# Patient Record
Sex: Female | Born: 2000 | Race: Black or African American | Hispanic: No | Marital: Single | Smoking: Never smoker
Health system: Southern US, Community
[De-identification: ages and names within clinical notes are randomized; demographics above are authoritative.]

## PROBLEM LIST (undated history)

## (undated) DIAGNOSIS — F909 Attention-deficit hyperactivity disorder, unspecified type: Secondary | ICD-10-CM

## (undated) HISTORY — DX: Attention-deficit hyperactivity disorder, unspecified type: F90.9

## (undated) HISTORY — PX: NO PAST SURGERIES: SHX2092

---
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
## 2020-07-31 HISTORY — PX: ROOT CANAL: SHX2363

## 2020-11-04 ENCOUNTER — Ambulatory Visit
Admission: EM | Admit: 2020-11-04 | Discharge: 2020-11-04 | Disposition: A | Discharge status: Home / Self Care | Payer: BC Managed Care – PPO

## 2020-11-04 ENCOUNTER — Other Ambulatory Visit: Payer: Self-pay

## 2020-11-04 ENCOUNTER — Encounter: Payer: Self-pay | Admitting: *Deleted

## 2020-11-04 DIAGNOSIS — T50901A Poisoning by unspecified drugs, medicaments and biological substances, accidental (unintentional), initial encounter: Secondary | ICD-10-CM

## 2020-11-04 DIAGNOSIS — F909 Attention-deficit hyperactivity disorder, unspecified type: Secondary | ICD-10-CM

## 2020-11-04 NOTE — ED Provider Notes (Signed)
[facility]    CSN: [id number] Arrival date & time: 11/04/20  1117      History   Chief Complaint Chief Complaint  Patient presents with  . Drug Overdose    HPI Sylvia Walter is a 20 y.o. female presenting today for evaluation of extra dose of Adderall.  Reports that this morning she took her normal dose of Adderall at around 9 AM with her first alarm that woke her up, went back to sleep, woke up again and accidentally took another dose as she forgot that she had previously taking it.  Shortly after she began to feel palpitations, nausea as well as jittery.  The symptoms have improved.  Does continue to feel a slight dull sensation in her chest.  Total dose was 30 mg, takes 15 mg extended release daily in the morning for ADHD.  HPI  History reviewed. No pertinent past medical history.  There are no problems to display for this patient.   History reviewed. No pertinent surgical history.  OB History   No obstetric history on file.      Home Medications    Prior to Admission medications   Medication Sig Start Date End Date Taking? Authorizing Provider  amphetamine-dextroamphetamine (ADDERALL) 15 MG tablet Take 15 mg by mouth daily.   Yes [provider]    Family History History reviewed. No pertinent family history.  Social History Social History   Tobacco Use  . Smoking status: Never Smoker  . Smokeless tobacco: Never Used     Allergies   Other   Review of Systems Review of Systems  Constitutional: Negative for fatigue and fever.  HENT: Negative for congestion, sinus pressure and sore throat.   Eyes: Negative for photophobia, pain and visual disturbance.  Respiratory: Positive for chest tightness. Negative for cough and shortness of breath.   Cardiovascular: Positive for palpitations. Negative for chest pain.  Gastrointestinal: Negative for abdominal pain, nausea and vomiting.  Genitourinary: Negative for decreased urine volume  and hematuria.  Musculoskeletal: Negative for myalgias, neck pain and neck stiffness.  Neurological: Negative for dizziness, syncope, facial asymmetry, speech difficulty, weakness, light-headedness, numbness and headaches.     Physical Exam Triage Vital Signs ED Triage Vitals  Enc Vitals Group     BP 11/04/20 1130 125/85     Pulse Rate 11/04/20 1130 93     Resp 11/04/20 1130 18     Temp 11/04/20 1130 97.6 F (36.4 C)     Temp Source 11/04/20 1130 Oral     SpO2 11/04/20 1130 99 %     Weight --      Height --      Head Circumference --      Peak Flow --      Pain Score 11/04/20 1135 0     Pain Loc --      Pain Edu? --      Excl. in GC? --    No data found.  Updated Vital Signs BP 125/85 (BP Location: Right Arm)   Pulse (!) 109   Temp 97.6 F (36.4 C) (Oral)   Resp 18   LMP 10/18/2020   SpO2 99%   Visual Acuity Right Eye Distance:   Left Eye Distance:   Bilateral Distance:    Right Eye Near:   Left Eye Near:    Bilateral Near:     Physical Exam Vitals and nursing note reviewed.  Constitutional:      Appearance: She is well-developed and  well-nourished.     Comments: No acute distress  HENT:     Head: Normocephalic and atraumatic.     Nose: Nose normal.     Mouth/Throat:     Comments: Oral mucosa pink and moist, no tonsillar enlargement or exudate. Posterior pharynx patent and nonerythematous, no uvula deviation or swelling. Normal phonation. Eyes:     Conjunctiva/sclera: Conjunctivae normal.  Cardiovascular:     Rate and Rhythm: Regular rhythm. Tachycardia present.  Pulmonary:     Effort: Pulmonary effort is normal. No respiratory distress.     Comments: Breathing comfortably at rest, CTABL, no wheezing, rales or other adventitious sounds auscultated Abdominal:     General: There is no distension.  Musculoskeletal:        General: Normal range of motion.     Cervical back: Neck supple.  Skin:    General: Skin is warm and dry.  Neurological:      Mental Status: She is alert and oriented to person, place, and time.  Psychiatric:        Mood and Affect: Mood and affect normal.      UC Treatments / Results  Labs (all labs ordered are listed, but only abnormal results are displayed) Labs Reviewed - No data to display  EKG   Radiology No results found.  Procedures Procedures (including critical care time)  Medications Ordered in UC Medications - No data to display  Initial Impression / Assessment and Plan / UC Course  I have reviewed the triage vital signs and the nursing notes.  Pertinent labs & imaging results that were available during my care of the patient were reviewed by me and considered in my medical decision making (see chart for details).     Accidental ingestion of extra dose of Adderall, total dose was 30 mg which is less than total max dose.  EKG normal sinus rhythm with sinus arrhythmia.  No acute signs of ischemia or infarction.  Advised patient to continue to monitor symptoms, drink plenty of fluids over the next 24 hours.  If symptoms progressing or worsening to follow-up in emergency room. Final Clinical Impressions(s) / UC Diagnoses   Final diagnoses:  Accidental drug ingestion, initial encounter  Attention deficit hyperactivity disorder (ADHD), unspecified ADHD type     Discharge Instructions     Please drink plenty of fluids Please monitor symptoms over the next 24 to 48 hours If symptoms progressing worsening, developing increased chest pain, heart racing greater than 120, dizziness, lightheadedness please go to emergency room    ED Prescriptions    None     PDMP not reviewed this encounter.   Sharyon Cable [place] C, PA-C 11/04/20 1230

## 2020-11-04 NOTE — ED Triage Notes (Signed)
Pt took an extra dose of adderal this morning. Pt reported her heart was pounding. Pt also reported shaking . Pt also was nauseated.  Pt made a phone call and could not remember who she called.

## 2020-11-04 NOTE — Discharge Instructions (Signed)
Please drink plenty of fluids Please monitor symptoms over the next 24 to 48 hours If symptoms progressing worsening, developing increased chest pain, heart racing greater than 120, dizziness, lightheadedness please go to emergency room

## 2020-12-22 ENCOUNTER — Ambulatory Visit
Admission: EM | Admit: 2020-12-22 | Discharge: 2020-12-22 | Disposition: A | Discharge status: Home / Self Care | Payer: BC Managed Care – PPO

## 2020-12-22 ENCOUNTER — Other Ambulatory Visit: Payer: Self-pay

## 2020-12-23 ENCOUNTER — Other Ambulatory Visit (HOSPITAL_COMMUNITY)
Admission: RE | Admit: 2020-12-23 | Discharge: 2020-12-23 | Disposition: A | Discharge status: Home / Self Care | Payer: BC Managed Care – PPO | Source: Ambulatory Visit | Attending: Nurse Practitioner | Admitting: Nurse Practitioner

## 2020-12-23 ENCOUNTER — Other Ambulatory Visit: Payer: Self-pay | Admitting: Nurse Practitioner

## 2020-12-23 DIAGNOSIS — Z113 Encounter for screening for infections with a predominantly sexual mode of transmission: Secondary | ICD-10-CM | POA: Insufficient documentation

## 2020-12-24 ENCOUNTER — Emergency Department (HOSPITAL_COMMUNITY)
Admission: EM | Admit: 2020-12-24 | Discharge: 2020-12-24 | Disposition: A | Discharge status: Home / Self Care | Payer: BC Managed Care – PPO | Attending: Emergency Medicine | Admitting: Emergency Medicine

## 2020-12-24 ENCOUNTER — Encounter (HOSPITAL_COMMUNITY): Payer: Self-pay

## 2020-12-24 ENCOUNTER — Other Ambulatory Visit: Payer: Self-pay

## 2020-12-24 ENCOUNTER — Other Ambulatory Visit: Payer: Self-pay | Admitting: Nurse Practitioner

## 2020-12-24 ENCOUNTER — Other Ambulatory Visit: Payer: BC Managed Care – PPO

## 2020-12-24 DIAGNOSIS — R000 Tachycardia, unspecified: Secondary | ICD-10-CM | POA: Insufficient documentation

## 2020-12-24 DIAGNOSIS — R599 Enlarged lymph nodes, unspecified: Secondary | ICD-10-CM

## 2020-12-24 DIAGNOSIS — B2790 Infectious mononucleosis, unspecified without complication: Secondary | ICD-10-CM | POA: Diagnosis not present

## 2020-12-24 DIAGNOSIS — R070 Pain in throat: Secondary | ICD-10-CM | POA: Diagnosis present

## 2020-12-24 DIAGNOSIS — J029 Acute pharyngitis, unspecified: Secondary | ICD-10-CM | POA: Diagnosis not present

## 2020-12-24 LAB — MOLECULAR ANCILLARY ONLY
Chlamydia: POSITIVE — AB
Comment: NEGATIVE
Comment: NORMAL
Neisseria Gonorrhea: NEGATIVE

## 2020-12-24 LAB — MONONUCLEOSIS SCREEN: Mono Screen: POSITIVE — AB

## 2020-12-24 MED ORDER — LIDOCAINE VISCOUS HCL 2 % MT SOLN
15.0000 mL | Freq: Once | OROMUCOSAL | Status: AC
  Administered 2020-12-24: 15 mL via OROMUCOSAL
  Filled 2020-12-24: qty 15

## 2020-12-24 MED ORDER — CEFTRIAXONE SODIUM 500 MG IJ SOLR
500.0000 mg | Freq: Once | INTRAMUSCULAR | Status: AC
  Administered 2020-12-24: 500 mg via INTRAMUSCULAR
  Filled 2020-12-24: qty 500

## 2020-12-24 MED ORDER — ACETAMINOPHEN 500 MG PO TABS
1000.0000 mg | ORAL_TABLET | Freq: Once | ORAL | Status: DC
  Filled 2020-12-24: qty 2

## 2020-12-24 MED ORDER — STERILE WATER FOR INJECTION IJ SOLN
INTRAMUSCULAR | Status: AC
  Filled 2020-12-24: qty 10

## 2020-12-24 MED ORDER — ACETAMINOPHEN 500 MG PO TABS: 1000.0000 mg | ORAL_TABLET | Freq: Four times a day (QID) | ORAL | 0 refills | Status: DC | PRN

## 2020-12-24 MED ORDER — DOXYCYCLINE HYCLATE 100 MG PO CAPS: 100.0000 mg | ORAL_CAPSULE | Freq: Two times a day (BID) | ORAL | 0 refills | Status: DC

## 2020-12-24 MED ORDER — LIDOCAINE VISCOUS HCL 2 % MT SOLN: 15.0000 mL | OROMUCOSAL | 0 refills | Status: DC | PRN

## 2020-12-24 NOTE — ED Provider Notes (Signed)
[facility] EMERGENCY DEPARTMENT Provider Note   CSN: [id number] Arrival date & time: 12/24/20  1153     History No chief complaint on file.   Sylvia Walter is a 20 y.o. female.  The history is provided by the patient and medical records. No language interpreter was used.     20 year old female presents complaint sore throat.  For the past 4 days patient has had sore throat.  She described as a very uncomfortable sensation especially with talking eating and drinking and swallowing.  Pain of moderate in severity radiates towards her ear and down her neck.  She denies any associated fever chills no nausea vomiting diarrhea no loss of taste or smell no chest pain shortness of breath abdominal pain or back pain.  She was seen at an outside hospital yesterday for complaint and had a negative strep test.  She was prescribed azithromycin, and lozenges.  She still having discomfort prompting this ER visit.  When asked, she did report having new sexual partner and recently had oral sex as well.  Symptoms started shortly after that.  She denies any abnormal rash or vaginal bleeding or vaginal discharge.  History reviewed. No pertinent past medical history.  There are no problems to display for this patient.   History reviewed. No pertinent surgical history.   OB History   No obstetric history on file.     No family history on file.  Social History   Tobacco Use  . Smoking status: Never Smoker  . Smokeless tobacco: Never Used    Home Medications Prior to Admission medications   Medication Sig Start Date End Date Taking? Authorizing Provider  amphetamine-dextroamphetamine (ADDERALL) 15 MG tablet Take 15 mg by mouth daily.    [provider]    Allergies    Other  Review of Systems   Review of Systems  All other systems reviewed and are negative.   Physical Exam Updated Vital Signs BP 122/72 (BP Location: Right Arm)   Pulse (!) 103   Temp  99.5 F (37.5 C) (Oral)   Resp 17   SpO2 100%   Physical Exam Vitals and nursing note reviewed.  Constitutional:      General: She is not in acute distress.    Appearance: She is well-developed.  HENT:     Head: Atraumatic.     Mouth/Throat:     Comments: Uvula midline bilateral tonsillar enlargement with exudates but no trismus noted.  No petechiae. Eyes:     Conjunctiva/sclera: Conjunctivae normal.  Cardiovascular:     Rate and Rhythm: Tachycardia present.     Pulses: Normal pulses.     Heart sounds: Normal heart sounds.  Pulmonary:     Effort: Pulmonary effort is normal.     Breath sounds: Normal breath sounds.  Abdominal:     Palpations: Abdomen is soft.     Comments: No splenomegaly  Musculoskeletal:     Cervical back: Neck supple.  Skin:    Findings: No rash.  Neurological:     Mental Status: She is alert and oriented to person, place, and time.  Psychiatric:        Mood and Affect: Mood normal.     ED Results / Procedures / Treatments   Labs (all labs ordered are listed, but only abnormal results are displayed) Labs Reviewed  MONONUCLEOSIS SCREEN - Abnormal; Notable for the following components:      Result Value   Mono Screen POSITIVE (*)  All other components within normal limits    EKG None  Radiology No results found.  Procedures Procedures   Medications Ordered in ED Medications  acetaminophen (TYLENOL) tablet 1,000 mg (has no administration in time range)  cefTRIAXone (ROCEPHIN) injection 500 mg (has no administration in time range)  lidocaine (XYLOCAINE) 2 % viscous mouth solution 15 mL (15 mLs Mouth/Throat Given 12/24/20 1417)    ED Course  I have reviewed the triage vital signs and the nursing notes.  Pertinent labs & imaging results that were available during my care of the patient were reviewed by me and considered in my medical decision making (see chart for details).    MDM Rules/Calculators/A&P                          BP  (!) 125/96   Pulse (!) 116   Temp 99.5 F (37.5 C) (Oral)   Resp 16   SpO2 100%   Final Clinical Impression(s) / ED Diagnoses Final diagnoses:  Acute pharyngitis due to infectious mononucleosis    Rx / DC Orders ED Discharge Orders         Ordered    lidocaine (XYLOCAINE) 2 % solution  As needed        12/24/20 1513    acetaminophen (TYLENOL) 500 MG tablet  Every 6 hours PRN        12/24/20 1513    doxycycline (VIBRAMYCIN) 100 MG capsule  2 times daily        12/24/20 1513         1:54 PM Patient here with sore throat, and had negative strep test yesterday.  No throat exam remarkable for bilateral tonsillar enlargement with exudates but no deviation of the uvula and no peritonsillar abscess.  Low suspicion for retropharyngeal abscess or any kind of airway compromise.  Other differential was mononucleosis, gonococcal pharyngitis, strep pharyngitis   Mono test positive.  Will treat symptoms.  2:49 PM Patient is notified that her throat swab from yesterday also positive for chlamydia.  She has not received any specific treatment yet.  She mention her pregnancy test from yesterday was negative.  Given this finding, will cover for STI infection with Rocephin and doxycycline.  I will also prescribe viscous lidocaine for symptom control, Tylenol as needed for fever and pain.  Return precaution given.   Fayrene Helper, PA-C 12/24/20 1519    Jacalyn Lefevre, MD 12/24/20 [phone]

## 2020-12-24 NOTE — ED Triage Notes (Signed)
Patient complains of intermittent sore throat that originally started 2 weeks ago and then returned again on Saturday. Pain with swallowing and pain to left ear. Had negative strep yesterday

## 2020-12-24 NOTE — Discharge Instructions (Addendum)
You test positive for mononucleosis.  Please drink plenty of fluid, take Tylenol as needed for pain and fever, use viscous lidocaine liquid swish and spit or swish and swallow as needed for sore throat.  Since you mentioned that your chlamydia test is positive, I have prescribed antibiotic as treatment.

## 2020-12-24 NOTE — ED Triage Notes (Signed)
Emergency Medicine Provider Triage Evaluation Note  MARY-ANNE POLIZZI , a 20 y.o. female  was evaluated in triage.  Pt complains of sore throat since Saturday, now have difficulty swallowing. Tested for strep yesterday was negative.   Review of Systems  Positive: Sore throat, difficulty swallowing, ear pain Negative: fever  Physical Exam  BP 113/78 (BP Location: Left Arm)   Pulse (!) 101   Temp 99.6 F (37.6 C)   Resp 14   SpO2 100%  Gen:   Awake, no distress   HEENT:  No trismus, drooling, handling secretions. Resp:  Normal effort  Cardiac:  Normal rate  Abd:   Nondistended, nontender  MSK:   Moves extremities without difficulty Neuro:  Speech clear   Medical Decision Making  Medically screening exam initiated at 12:22 PM.  Appropriate orders placed.  Candelaria R Colao was informed that the remainder of the evaluation will be completed by another provider, this initial triage assessment does not replace that evaluation, and the importance of remaining in the ED until their evaluation is complete.  Clinical Impression  Stable, no immediate airway compromise  MSE was initiated and I personally evaluated the patient and placed orders (if any) at  12:23 PM on December 24, 2020.  The patient appears stable so that the remainder of the MSE may be completed by another provider.    Farrel Gordon, PA-C 12/24/20 1223

## 2021-01-31 ENCOUNTER — Other Ambulatory Visit (HOSPITAL_COMMUNITY)
Admission: RE | Admit: 2021-01-31 | Discharge: 2021-01-31 | Disposition: A | Discharge status: Home / Self Care | Payer: BC Managed Care – PPO | Source: Ambulatory Visit | Attending: Family Medicine | Admitting: Family Medicine

## 2021-01-31 ENCOUNTER — Other Ambulatory Visit: Payer: Self-pay | Admitting: Family Medicine

## 2021-01-31 DIAGNOSIS — N898 Other specified noninflammatory disorders of vagina: Secondary | ICD-10-CM | POA: Diagnosis present

## 2021-02-05 LAB — MOLECULAR ANCILLARY ONLY
Bacterial Vaginitis (gardnerella): NEGATIVE
Candida Glabrata: NEGATIVE
Candida Vaginitis: NEGATIVE
Chlamydia: NEGATIVE
Comment: NEGATIVE
Comment: NEGATIVE
Comment: NEGATIVE
Comment: NEGATIVE
Comment: NEGATIVE
Comment: NORMAL
Neisseria Gonorrhea: NEGATIVE
Trichomonas: NEGATIVE

## 2021-09-07 NOTE — L&D Delivery Note (Addendum)
Delivery Note Sylvia Walter is a 21 y.o. G1P1002 at [redacted]w[redacted]d admitted for IOL d/t DCDA twins, GHTN, A1DM.   GBS Status: Negative/-- (06/28 1632) Maximum Maternal Temperature: 99.3  Labor course: Initial SVE: 1.5/50/-3. Augmentation with: AROM, Cytotec, and IP Foley. She then progressed to complete.  ROM: 4h 74m with clear fluid  Moved to OR for delivery d/t twins and Cat 2 tracing Twin B Birth: At 0042 a viable female was delivered via spontaneous vaginal delivery (Presentation: LOA w/ compound hand). Nuchal cord present: Yes, loose x 1, immediately reduced at birth.  Shoulders and body delivered in usual fashion. Infant placed directly on mom's abdomen for bonding/skin-to-skin, baby dried and stimulated. Cord clamped x 2 after 1 minute and cut by FOB. Informal bedside u/s by Dr. Macon Large, Baby B was breech, BBOW felt by Dr. Macon Large and myself, pt pushed ineffectively at first, then much better- AROM after presenting part out, which was the buttocks, breech delivery of Baby B at 0058 by Dr. Macon Large. Cord clamped x 2 and cut and infant taken immediately to warmer for resuscitation by NICU team.   The placenta separated spontaneously with significant clots and delivered simultaneously with Baby B concerning for abruption.  Pitocin infused rapidly IV per protocol.  Fundus boggy, large amt clots removed from LUS. Given methergine x 1, TXA, IV pitocin and then Cytotec pr w/ firming of fundus and slowed bleeding.  Placenta inspected and appears to be intact with a 3 VC x 2.  Placenta/Cord with the following complications: multiple calcifications on both and large clots adherent to them.  Cord pH: drawn for Baby B pH 6.9 Sponge and instrument count were correct x2.  Intrapartum complications:  Abruptio Placenta BabyB Anesthesia:  epidural & local for repair per pt request Episiotomy: none Lacerations:  1st degree Suture Repair: 3.0 vicryl EBL (mL): 400   Infant: APGARS Baby A: 8/9 Baby B:  1/8, aggressively resuscitated by Neonatology team  Infant weight: pending  Mom to postpartum.  Baby to Couplet care / Skin to Skin.  Plans to Breastfeed Contraception:  undecided Circumcision: N/A  Note sent to [facility]: [facility] for pp visit.  [name] CNM, [facility] 03/18/2022 1:31 AM    I was present for entire delivery and did the delivery of Baby B. Agree with documentation above.    [name], MD, FACOG Obstetrician & Gynecologist, [facility] for [employer], [facility]

## 2021-09-29 ENCOUNTER — Ambulatory Visit (INDEPENDENT_AMBULATORY_CARE_PROVIDER_SITE_OTHER): Payer: BC Managed Care – PPO

## 2021-09-29 DIAGNOSIS — O0990 Supervision of high risk pregnancy, unspecified, unspecified trimester: Secondary | ICD-10-CM | POA: Insufficient documentation

## 2021-09-29 DIAGNOSIS — Z3401 Encounter for supervision of normal first pregnancy, first trimester: Secondary | ICD-10-CM | POA: Insufficient documentation

## 2021-09-29 DIAGNOSIS — O30041 Twin pregnancy, dichorionic/diamniotic, first trimester: Secondary | ICD-10-CM

## 2021-09-29 MED ORDER — BLOOD PRESSURE KIT DEVI: 1.0000 | 0 refills | Status: DC

## 2021-09-29 NOTE — Progress Notes (Signed)
New OB Intake  I connected with  Sylvia Walter on 09/29/21 at  2:00 PM EST by telephone Video Visit and verified that I am speaking with the correct person using two identifiers. Nurse is located at [facility] and pt is located at Home.  I discussed the limitations, risks, security and privacy concerns of performing an evaluation and management service by telephone and the availability of in person appointments. I also discussed with the patient that there may be a patient responsible charge related to this service. The patient expressed understanding and agreed to proceed.  I explained I am completing New OB Intake today. We discussed her EDD of 04/05/22 that is based on early u/s. Pt is G1/P0. I reviewed her allergies, medications, Medical/Surgical/OB history, and appropriate screenings. I informed her of [facility] services. Based on history, this is a/an  pregnancy uncomplicated .   There are no problems to display for this patient.   Concerns addressed today  Delivery Plans:  Plans to deliver at [facility].   MyChart/Babyscripts MyChart access verified. I explained pt will have some visits in office and some virtually. Babyscripts instructions given and order placed. Patient verifies receipt of registration text/e-mail. Account successfully created and app downloaded.  Blood Pressure Cuff  Blood pressure cuff ordered for patient to pick-up from [employer]. Explained after first prenatal appt pt will check weekly and document in Babyscripts.  Weight scale: Patient does / does not  have weight scale. Weight scale ordered for patient to pick up from [employer].   Anatomy US Explained first scheduled [place] will be around 19 weeks. Anatomy US ordered.  Labs Discussed Avelina Laine genetic screening with patient. Would like both Panorama and Horizon drawn at new OB visit. Routine prenatal labs needed.  Covid Vaccine Patient has covid vaccine.    Informed patient of [facility]  website  and placed link in her AVS.   Social Determinants of Health Food Insecurity: Patient expresses food insecurity. Food Market information given to patient; explained patient may visit at the end of first OB appointment. WIC Referral: Patient is interested in referral to [facility].  Transportation: Patient denies transportation needs. Childcare: Discussed no children allowed at ultrasound appointments. Offered childcare services; patient declines childcare services at this time.  Send link to Pregnancy Navigators   Placed OB Box on problem list and updated  First visit review I reviewed new OB appt with pt. I explained she will have a pelvic exam, ob bloodwork with genetic screening, and PAP smear. Explained pt will be seen by Mariel Aloe at first visit; encounter routed to appropriate provider. Explained that patient will be seen by pregnancy navigator following visit with provider. [facility] information placed in AVS.   Hamilton Capri, RN 09/29/2021  1:49 PM

## 2021-09-30 NOTE — Progress Notes (Signed)
Patient was assessed and managed by nursing staff during this encounter. I have reviewed the chart and agree with the documentation and plan. I have also made any necessary editorial changes.  Catalina Antigua, MD 09/30/2021 5:44 PM

## 2021-10-01 ENCOUNTER — Other Ambulatory Visit: Payer: Self-pay

## 2021-10-01 ENCOUNTER — Other Ambulatory Visit (HOSPITAL_COMMUNITY)
Admission: RE | Admit: 2021-10-01 | Discharge: 2021-10-01 | Disposition: A | Discharge status: Home / Self Care | Payer: BC Managed Care – PPO | Source: Ambulatory Visit | Attending: Obstetrics and Gynecology | Admitting: Obstetrics and Gynecology

## 2021-10-01 ENCOUNTER — Ambulatory Visit (INDEPENDENT_AMBULATORY_CARE_PROVIDER_SITE_OTHER): Payer: BC Managed Care – PPO | Admitting: Licensed Clinical Social Worker

## 2021-10-01 ENCOUNTER — Ambulatory Visit (INDEPENDENT_AMBULATORY_CARE_PROVIDER_SITE_OTHER): Payer: BC Managed Care – PPO | Admitting: Obstetrics and Gynecology

## 2021-10-01 VITALS — BP 110/72 | HR 92 | Ht 67.0 in | Wt 146.7 lb

## 2021-10-01 DIAGNOSIS — O30041 Twin pregnancy, dichorionic/diamniotic, first trimester: Secondary | ICD-10-CM | POA: Insufficient documentation

## 2021-10-01 DIAGNOSIS — R87612 Low grade squamous intraepithelial lesion on cytologic smear of cervix (LGSIL): Secondary | ICD-10-CM | POA: Insufficient documentation

## 2021-10-01 DIAGNOSIS — R112 Nausea with vomiting, unspecified: Secondary | ICD-10-CM | POA: Insufficient documentation

## 2021-10-01 DIAGNOSIS — Z3A13 13 weeks gestation of pregnancy: Secondary | ICD-10-CM | POA: Diagnosis not present

## 2021-10-01 DIAGNOSIS — Z3401 Encounter for supervision of normal first pregnancy, first trimester: Secondary | ICD-10-CM

## 2021-10-01 DIAGNOSIS — O30049 Twin pregnancy, dichorionic/diamniotic, unspecified trimester: Secondary | ICD-10-CM | POA: Insufficient documentation

## 2021-10-01 DIAGNOSIS — O219 Vomiting of pregnancy, unspecified: Secondary | ICD-10-CM

## 2021-10-01 MED ORDER — DOXYLAMINE-PYRIDOXINE 10-10 MG PO TBEC: 2.0000 | DELAYED_RELEASE_TABLET | Freq: Every day | ORAL | 5 refills | Status: DC

## 2021-10-01 NOTE — Progress Notes (Signed)
INITIAL PRENATAL VISIT NOTE  Subjective:  Sylvia Walter is a 21 y.o. G1P0 at 25w3dby [facility] department ultrasound being seen today for her initial prenatal visit. She has an obstetric history significant for nulliparity. She has a medical history significant for nothing.  Patient reports nausea.  Contractions: Irritability. Vag. Bleeding: None.   . Denies leaking of fluid.    Past Medical History:  Diagnosis Date   ADHD     No past surgical history on file.  OB History  Gravida Para Term Preterm AB Living  1         0  SAB IAB Ectopic Multiple Live Births          0    # Outcome Date GA Lbr Len/2nd Weight Sex Delivery Anes PTL Lv  1 Current             Social History   Socioeconomic History   Marital status: Single    Spouse name: Not on file   Number of children: Not on file   Years of education: Not on file   Highest education level: Not on file  Occupational History   Not on file  Tobacco Use   Smoking status: Never   Smokeless tobacco: Never  Vaping Use   Vaping Use: Never used  Substance and Sexual Activity   Alcohol use: Not Currently    Comment: occ, prior to pregnancy   Drug use: Never   Sexual activity: Not on file  Other Topics Concern   Not on file  Social History Narrative   Not on file   Social Determinants of Health   Financial Resource Strain: Not on file  Food Insecurity: Not on file  Transportation Needs: Not on file  Physical Activity: Not on file  Stress: Not on file  Social Connections: Not on file    No family history on file.   Current Outpatient Medications:    Doxylamine-Pyridoxine (DICLEGIS) 10-10 MG TBEC, Take 2 tablets by mouth at bedtime. If symptoms persist, add one tablet in the morning and one in the afternoon, Disp: 100 tablet, Rfl: 5   Prenatal Vit-Fe Fumarate-FA (PRENATAL MULTIVITAMIN) TABS tablet, Take 1 tablet by mouth daily at 12 noon., Disp: , Rfl:    acetaminophen (TYLENOL) 500 MG tablet, Take 2 tablets  (1,000 mg total) by mouth every 6 (six) hours as needed for moderate pain, fever or headache. (Patient not taking: Reported on 09/29/2021), Disp: 30 tablet, Rfl: 0   amphetamine-dextroamphetamine (ADDERALL XR) 10 MG 24 hr capsule, Take 10 mg by mouth every morning. (Patient not taking: Reported on 09/29/2021), Disp: , Rfl:    Blood Pressure Monitoring (BLOOD PRESSURE KIT) DEVI, 1 kit by Does not apply route once a week., Disp: 1 each, Rfl: 0   ondansetron (ZOFRAN-ODT) 4 MG disintegrating tablet, 4 mg 2 (two) times daily. (Patient not taking: Reported on 09/29/2021), Disp: , Rfl:    promethazine (PHENERGAN) 25 MG tablet, Take 25 mg by mouth 3 (three) times daily. (Patient not taking: Reported on 09/29/2021), Disp: , Rfl:   Allergies  Allergen Reactions   Other Other (See Comments)    Grass/ pecans    Review of Systems: Negative except for what is mentioned in HPI.  Objective:   Vitals:   10/01/21 1321 10/01/21 1322  BP: 110/72   Pulse: 92   Weight: 146 lb 11.2 oz (66.5 kg)   Height:  5' 7"  (1.702 m)    Fetal Status:  Physical Exam: BP 110/72    Pulse 92    Ht 5' 7"  (1.702 m)    Wt 146 lb 11.2 oz (66.5 kg)    BMI 22.98 kg/m  CONSTITUTIONAL: Well-developed, well-nourished female in no acute distress.  NEUROLOGIC: Alert and oriented to person, place, and time. Normal reflexes, muscle tone coordination. No cranial nerve deficit noted. PSYCHIATRIC: Normal mood and affect. Normal behavior. Normal judgment and thought content. SKIN: Skin is warm and dry. No rash noted. Not diaphoretic. No erythema. No pallor. HENT:  Normocephalic, atraumatic, External right and left ear normal. Oropharynx is clear and moist EYES: Conjunctivae and EOM are normal.  NECK: Normal range of motion, supple, no masses CARDIOVASCULAR: Normal heart rate noted, regular rhythm RESPIRATORY: Effort and breath sounds normal, no problems with respiration noted BREASTS: deferred ABDOMEN: Soft, nontender,  nondistended, gravid. GU: normal appearing external female genitalia, nulliparous, normal appearing cervix, scant white discharge in vagina, no lesions noted Bimanual: 15 weeks sized uterus, no adnexal tenderness or palpable lesions noted MUSCULOSKELETAL: Normal range of motion. EXT:  No edema and no tenderness. 2+ distal pulses.  Bedside ultrasound showed viable fetus x 2 with movement, appears to be di/di  Assessment and Plan:  Pregnancy: G1P0 at 90w3dby u/s  1. Encounter for supervision of normal first pregnancy in first trimester Continue routine care, AFP at next visit.  - CBC/D/Plt+RPR+Rh+ABO+RubIgG... - Culture, OB Urine - Genetic Screening - Cytology - PAP - Cervicovaginal ancillary only  2. [redacted] weeks gestation of pregnancy   3. Twin pregnancy, dichorionic/diamniotic, first trimester Confirm at anatomy scan  4. Nausea and vomiting during pregnancy Trial of diclegis  - Doxylamine-Pyridoxine (DICLEGIS) 10-10 MG TBEC; Take 2 tablets by mouth at bedtime. If symptoms persist, add one tablet in the morning and one in the afternoon  Dispense: 100 tablet; Refill: 5   Preterm labor symptoms and general obstetric precautions including but not limited to vaginal bleeding, contractions, leaking of fluid and fetal movement were reviewed in detail with the patient.  Please refer to After Visit Summary for other counseling recommendations.   Return in about 4 weeks (around 10/29/2021[facility]  LGriffin Basil1/25/2023 1:58 PM

## 2021-10-01 NOTE — Patient Instructions (Signed)
For colds and allergies  Any anti-histamine including benadryl, allegra, claritin, etc.  Sudafed but not phenylephrine  Mucinex  Robitussin  For Reflux/heartburn  Pepcid Zantac Tums Prilosec Prevacid  For yeast infections  Monistat  For constipation  Colace  For minor aches and pains  Tylenol-do not take more than 4000mg in 24 hours. Therma-care or like heat packs  

## 2021-10-02 LAB — CBC/D/PLT+RPR+RH+ABO+RUBIGG...
Antibody Screen: NEGATIVE
Basophils Absolute: 0 10*3/uL (ref 0.0–0.2)
Basos: 0 %
EOS (ABSOLUTE): 0.2 10*3/uL (ref 0.0–0.4)
Eos: 2 %
HCV Ab: <0.1 s/co ratio (ref 0.0–0.9)
HIV Screen 4th Generation wRfx: NONREACTIVE
Hematocrit: 35.6 % (ref 34.0–46.6)
Hemoglobin: 11.1 g/dL (ref 11.1–15.9)
Hepatitis B Surface Ag: NEGATIVE
Immature Grans (Abs): 0 10*3/uL (ref 0.0–0.1)
Immature Granulocytes: 0 %
Lymphocytes Absolute: 1.7 10*3/uL (ref 0.7–3.1)
Lymphs: 18 %
MCH: 23.4 pg — ABNORMAL LOW (ref 26.6–33.0)
MCHC: 31.2 g/dL — ABNORMAL LOW (ref 31.5–35.7)
MCV: 75 fL — ABNORMAL LOW (ref 79–97)
Monocytes Absolute: 0.6 10*3/uL (ref 0.1–0.9)
Monocytes: 7 %
Neutrophils Absolute: 6.7 10*3/uL (ref 1.4–7.0)
Neutrophils: 73 %
Platelets: 255 10*3/uL (ref 150–450)
RBC: 4.74 x10E6/uL (ref 3.77–5.28)
RDW: 17.1 % — ABNORMAL HIGH (ref 11.7–15.4)
RPR Ser Ql: NONREACTIVE
Rh Factor: POSITIVE
Rubella Antibodies, IGG: 8.69 index (ref >0.99)
WBC: 9.2 10*3/uL (ref 3.4–10.8)

## 2021-10-02 LAB — HCV INTERPRETATION

## 2021-10-02 NOTE — BH Specialist Note (Signed)
[facility] Initial In-Person Visit  MRN: [id number] Name: Sylvia Walter  Number of [facility] Clinician visits:: 1 Session Start time: 1:46pm  Session End time: 2:01 Total time: 15 minutes in person at [place]. Linton Rump and M Sheehan completed new ob introduction. Ms. Arambul expressed full understanding of practice offerings and services.     Lynnea Ferrier, LCSW

## 2021-10-03 LAB — CERVICOVAGINAL ANCILLARY ONLY
Bacterial Vaginitis (gardnerella): NEGATIVE
Candida Glabrata: NEGATIVE
Candida Vaginitis: NEGATIVE
Chlamydia: NEGATIVE
Comment: NEGATIVE
Comment: NEGATIVE
Comment: NEGATIVE
Comment: NEGATIVE
Comment: NEGATIVE
Comment: NORMAL
Neisseria Gonorrhea: NEGATIVE
Trichomonas: NEGATIVE

## 2021-10-03 LAB — CULTURE, OB URINE

## 2021-10-03 LAB — URINE CULTURE, OB REFLEX

## 2021-10-07 LAB — CYTOLOGY - PAP
Adequacy: ABSENT
Chlamydia: NEGATIVE
Comment: NEGATIVE
Comment: NORMAL
Neisseria Gonorrhea: NEGATIVE

## 2021-10-13 ENCOUNTER — Encounter: Payer: Self-pay | Admitting: Obstetrics and Gynecology

## 2021-10-25 ENCOUNTER — Encounter: Payer: Self-pay | Admitting: Obstetrics & Gynecology

## 2021-10-25 DIAGNOSIS — D563 Thalassemia minor: Secondary | ICD-10-CM

## 2021-10-25 HISTORY — DX: Thalassemia minor: D56.3

## 2021-10-27 ENCOUNTER — Telehealth: Payer: Self-pay

## 2021-10-27 DIAGNOSIS — D563 Thalassemia minor: Secondary | ICD-10-CM

## 2021-10-27 NOTE — Telephone Encounter (Signed)
S/w pt and advised of  genetic results and options. Genetic referral placed

## 2021-10-27 NOTE — Telephone Encounter (Signed)
Attempted to contact about genetic results, no answer, left vm

## 2021-10-29 ENCOUNTER — Other Ambulatory Visit: Payer: Self-pay

## 2021-10-29 ENCOUNTER — Ambulatory Visit (INDEPENDENT_AMBULATORY_CARE_PROVIDER_SITE_OTHER): Payer: BC Managed Care – PPO | Admitting: Obstetrics and Gynecology

## 2021-10-29 ENCOUNTER — Encounter: Payer: Self-pay | Admitting: Obstetrics and Gynecology

## 2021-10-29 VITALS — BP 116/74 | HR 91 | Wt 153.9 lb

## 2021-10-29 DIAGNOSIS — D563 Thalassemia minor: Secondary | ICD-10-CM

## 2021-10-29 DIAGNOSIS — O0992 Supervision of high risk pregnancy, unspecified, second trimester: Secondary | ICD-10-CM

## 2021-10-29 DIAGNOSIS — O30041 Twin pregnancy, dichorionic/diamniotic, first trimester: Secondary | ICD-10-CM

## 2021-10-29 NOTE — Progress Notes (Signed)
Subjective:  Sylvia Walter is a 21 y.o. G1P0 at [redacted]w[redacted]d being seen today for ongoing prenatal care.  She is currently monitored for the following issues for this high-risk pregnancy and has Supervision of high-risk pregnancy; Twin pregnancy, dichorionic/diamniotic, first trimester; Nausea and vomiting during pregnancy; and Alpha thalassemia silent carrier on their problem list.  Patient reports no complaints.  Contractions: Not present. Vag. Bleeding: None.   . Denies leaking of fluid.   The following portions of the patient's history were reviewed and updated as appropriate: allergies, current medications, past family history, past medical history, past social history, past surgical history and problem list. Problem list updated.  Objective:   Vitals:   10/29/21 1515  BP: 116/74  Pulse: 91  Weight: 153 lb 14.4 oz (69.8 kg)    Fetal Status: Fetal Heart Rate (bpm): 138/134         General:  Alert, oriented and cooperative. Patient is in no acute distress.  Skin: Skin is warm and dry. No rash noted.   Cardiovascular: Normal heart rate noted  Respiratory: Normal respiratory effort, no problems with respiration noted  Abdomen: Soft, gravid, appropriate for gestational age. Pain/Pressure: Present     Pelvic:  Cervical exam deferred        Extremities: Normal range of motion.     Mental Status: Normal mood and affect. Normal behavior. Normal judgment and thought content.   Urinalysis:      Assessment and Plan:  Pregnancy: G1P0 at [redacted]w[redacted]d  1. Supervision of high risk pregnancy in second trimester Stable  2. Twin pregnancy, dichorionic/diamniotic, first trimester Stable Anatomy scan next month Serial growth scans as per MFM  3. Alpha thalassemia silent carrier FOB has been advised to be tested  Preterm labor symptoms and general obstetric precautions including but not limited to vaginal bleeding, contractions, leaking of fluid and fetal movement were reviewed in detail with the  patient. Please refer to After Visit Summary for other counseling recommendations.  Return in about 4 weeks (around 11/26/2021) for OB visit, face to face, MD only.   Chancy Milroy, MD

## 2021-10-29 NOTE — Patient Instructions (Signed)

## 2021-10-29 NOTE — Progress Notes (Deleted)
Pt requesting Zofran

## 2021-11-10 ENCOUNTER — Ambulatory Visit: Payer: BC Managed Care – PPO | Attending: Obstetrics and Gynecology | Admitting: Obstetrics and Gynecology

## 2021-11-10 ENCOUNTER — Ambulatory Visit: Payer: BC Managed Care – PPO | Admitting: *Deleted

## 2021-11-10 ENCOUNTER — Other Ambulatory Visit: Payer: Self-pay

## 2021-11-10 ENCOUNTER — Other Ambulatory Visit: Payer: Self-pay | Admitting: *Deleted

## 2021-11-10 ENCOUNTER — Ambulatory Visit: Payer: BC Managed Care – PPO | Attending: Obstetrics and Gynecology

## 2021-11-10 ENCOUNTER — Ambulatory Visit: Payer: BC Managed Care – PPO

## 2021-11-10 ENCOUNTER — Encounter: Payer: Self-pay | Admitting: *Deleted

## 2021-11-10 VITALS — BP 116/63 | HR 89

## 2021-11-10 DIAGNOSIS — O30042 Twin pregnancy, dichorionic/diamniotic, second trimester: Secondary | ICD-10-CM | POA: Diagnosis not present

## 2021-11-10 DIAGNOSIS — D563 Thalassemia minor: Secondary | ICD-10-CM | POA: Insufficient documentation

## 2021-11-10 DIAGNOSIS — Z3A19 19 weeks gestation of pregnancy: Secondary | ICD-10-CM | POA: Diagnosis not present

## 2021-11-10 DIAGNOSIS — Z148 Genetic carrier of other disease: Secondary | ICD-10-CM

## 2021-11-10 DIAGNOSIS — O30032 Twin pregnancy, monochorionic/diamniotic, second trimester: Secondary | ICD-10-CM | POA: Insufficient documentation

## 2021-11-10 DIAGNOSIS — Z3401 Encounter for supervision of normal first pregnancy, first trimester: Secondary | ICD-10-CM

## 2021-11-10 DIAGNOSIS — Z363 Encounter for antenatal screening for malformations: Secondary | ICD-10-CM | POA: Insufficient documentation

## 2021-11-10 DIAGNOSIS — O30041 Twin pregnancy, dichorionic/diamniotic, first trimester: Secondary | ICD-10-CM | POA: Diagnosis not present

## 2021-11-10 DIAGNOSIS — Z362 Encounter for other antenatal screening follow-up: Secondary | ICD-10-CM

## 2021-11-10 NOTE — Progress Notes (Signed)
Maternal-Fetal Medicine  ? ?Name: Sylvia Walter ?DOB: 04/25/2001 ?MRN: [id number] ?Referring Provider: Arlina Robes, MD ? ?I had the pleasure of seeing Sylvia Walter today at the [place] [facility]. She is G1 P0 at 19w 1d gestation with twin pregnancy. This is a natural conception. ? ?Patient had first trimester ultrasound at [redacted] weeks gestation at [place].  I reviewed the images, and they are consistent with dichorionic-diamniotic twin gestation. ? ?On cell-free fetal DNA screening, monozygotic twins with female sex twins were seen. ? ?Past medical history no history of hypertension or diabetes or any chronic medical conditions.  She does not have sickle cell trait and she is a silent carrier for alpha thalassemia. ?Past surgical history: Nil of note. ?Medications: Prenatal vitamins. ?Allergies: No known drug allergies. ?Social history: Denies tobacco or drug or alcohol use.  She lives with a partner who is African-American and he is in good health. ?Family history: No history of venous thromboembolism in the family. ? ?Ultrasound ?Dichorionic-diamniotic twin pregnancy. ? ?Twin A: Lower fetus, variable presentation, anterior placenta, female fetus.  Amniotic fluid is normal and good fetal activity seen.  Fetal biometry is consistent with the previously established dates.  No markers of aneuploidies or fetal structural defects are seen. ? ?Twin B: Upper fetus, variable presentation, posterior placenta, female fetus. Amniotic fluid is normal and good fetal activity seen.  Fetal biometry is consistent with the previously established dates.  No markers of aneuploidies or fetal structural defects are seen. ? ?Growth discordancy: 1% (normal). ?On transabdominal scan, the cervix looks long and closed. ? ?Dichorionic-diamniotic twin pregnancy ?I explained the significance of chorionicity and its implications. Cell-free fetal DNA screening showed monozygotic twins. I explained the difference between  monozygotic and monochorionic twins. Having dichorionic twins is more favorable and it is associated with fewer complications. ?The terms ?fraternal? and ?identical? should be avoided and the patient was made aware that division of embryo could have occurred within 3 days after fertilization. ? ?Possible complications associated with twin pregnancy that include preterm labor/delivery (most common), fetal growth restriction of one or both twins, malpresentations and increased cesarean delivery rate, postpartum hemorrhage. I also informed her that maternal complications including gestational diabetes and gestational hypertension/preeclampsia are more common. ? ?I discussed the mode of delivery that is based on the presentations.  If both have Vx/Vx or Vx/non-vertex presentations, vaginal delivery may be considered. In Vx/non-vx, vaginal delivery followed by internal podalic version of second twin will achieve vaginal delivery. In non-vx first twin presentation, cesarean delivery will be performed. ? ?I emphasized the importance of weight gain (24 lbs by 24 weeks) to improve fetal weight and decrease the chances of preterm delivery. ?Low-dose aspirin is beneficial in delaying or preventing preeclampsia. Twin pregnancy is at high risk for preeclampsia. ?I recommended aspirin (81 mg daily) to be started from now until delivery. She does not have contraindications to aspirin. ? ?Recommendations ?-Serial fetal growth assessments every 4 weeks. ?-We made an appointment for fetal echocardiography ([facility]) because of monozygotic twins. ?-Weekly antenatal testing at 21 and 37 weeks. ?-Delivery at 38 weeks.  ?-Aspirin 81 mg daily until delivery. ? ?Thank you for consultation.  If you have any questions or concerns, please contact me the [facility].  Consultation including face-to-face (more than 50%) counseling 30 minutes. ? ?

## 2021-11-26 ENCOUNTER — Ambulatory Visit (INDEPENDENT_AMBULATORY_CARE_PROVIDER_SITE_OTHER): Payer: BC Managed Care – PPO | Admitting: Obstetrics & Gynecology

## 2021-11-26 ENCOUNTER — Other Ambulatory Visit: Payer: Self-pay

## 2021-11-26 VITALS — BP 117/76 | HR 103 | Wt 168.0 lb

## 2021-11-26 DIAGNOSIS — Z3A21 21 weeks gestation of pregnancy: Secondary | ICD-10-CM

## 2021-11-26 DIAGNOSIS — O0992 Supervision of high risk pregnancy, unspecified, second trimester: Secondary | ICD-10-CM

## 2021-11-26 DIAGNOSIS — R21 Rash and other nonspecific skin eruption: Secondary | ICD-10-CM

## 2021-11-26 DIAGNOSIS — O30042 Twin pregnancy, dichorionic/diamniotic, second trimester: Secondary | ICD-10-CM

## 2021-11-26 NOTE — Progress Notes (Signed)
? ?PRENATAL VISIT NOTE ? ?Subjective:  ?Sylvia Walter is a 21 y.o. G1P0 at [redacted]w[redacted]d being seen today for ongoing prenatal care.  She is currently monitored for the following issues for this high-risk pregnancy and has Supervision of high-risk pregnancy; Dichorionic diamniotic twin pregnancy; Nausea and vomiting during pregnancy; and Alpha thalassemia silent carrier on their problem list. ? ?Patient reports rash on wrists and under breast that is itchy.  Contractions: Not present. Vag. Bleeding: None.  Movement: Present. Denies leaking of fluid.  ? ?The following portions of the patient's history were reviewed and updated as appropriate: allergies, current medications, past family history, past medical history, past social history, past surgical history and problem list.  ? ?Objective:  ? ?Vitals:  ? 11/26/21 1344  ?BP: 117/76  ?Pulse: (!) 103  ?Weight: 168 lb (76.2 kg)  ? ? ?Fetal Status: Fetal Heart Rate (bpm): 145/150   Movement: Present    ? ?General:  Alert, oriented and cooperative. Patient is in no acute distress.  ?Skin: Skin is warm and dry. Mild erythematous maculopapular rash noted on wrists and under left breast. Done in presence of chaperone.  ?Cardiovascular: Normal heart rate noted  ?Respiratory: Normal respiratory effort, no problems with respiration noted  ?Abdomen: Soft, gravid, appropriate for gestational age.  Pain/Pressure: Absent     ?Pelvic: Cervical exam deferred        ?Extremities: Normal range of motion.     ?Mental Status: Normal mood and affect. Normal behavior. Normal judgment and thought content.  ? ?[place] MFM OB DETAIL +14 WK ? ?Result Date: 11/10/2021 ?----------------------------------------------------------------------  OBSTETRICS REPORT                       (Signed Final 11/10/2021 01:08 pm) ---------------------------------------------------------------------- Patient Info  ID #:       [id number]                          D.O.B.:  10/27/2000 (20 yrs)  Name:       Sylvia Walter [facility]                 Visit Date: 11/10/2021 11:33 am ---------------------------------------------------------------------- Performed By  Attending:        Tama High MD        Secondary Phy.:   Vickii Chafe                                                             CONSTANT MD  Performed By:     Eveline Keto         Address:          Faculty                    RDMS  Referred By:      [place]             Location:         [facility]                                                             [facility] for                                                             Women  Ref. Address:     [place]                    [place]                    [place] ---------------------------------------------------------------------- Orders  #  Description                           Code        Ordered By  1  [place] MFM OB DETAIL +14 [place]               [id number]    [place]  2  [place] MFM OB DETAIL ADDL GEST            76811.02    LAWRENCE BASS     +14 [place] ----------------------------------------------------------------------  #  Order #                     Accession #                Episode #  1  [id number]                    [id number]                 [id number]  2  [id number]                    [id number]                 [id number] ---------------------------------------------------------------------- Indications  Twin pregnancy, di/di, second trimester        O30.042  [redacted] weeks gestation of pregnancy                Z3A.19  Antenatal screening for malformations          Z36.3  LR NIPS  Genetic carrier (silent carrier for alpha      Z14.8  thalassemia) ---------------------------------------------------------------------- Fetal Evaluation (Fetus A)  Num Of Fetuses:         2  Fetal Heart Rate(bpm):  148  Cardiac Activity:       Observed  Fetal Lie:              Lower Fetus  Presentation:           Variable   Placenta:  Anterior  P. Cord Insertion:      Visualized  Amniotic Fluid  AFI FV:      Within normal limits                              Largest Pocket(cm)                              5.8 ---------------------------------------------------------------------- Biometry (Fetus A)  BPD:      45.4  mm     G. Age:  19w 5d         75  %    CI:        71.76   %    70 - 86                                                          FL/HC:      17.4   %    16.1 - 18.3  HC:      170.6  mm     G. Age:  19w 5d         68  %    HC/AC:      1.22        1.09 - 1.39  AC:      139.8  mm     G. Age:  19w 3d         29  %    FL/BPD:     65.2   %  FL:       29.6  mm     G. Age:  19w 1d         42  %    FL/AC:      21.2   %    20 - 24  CER:      20.5  mm     G. Age:  19w 5d         66  %  NFT:       3.2  mm  LV:        5.4  mm  CM:        4.9  mm  Est. FW:     285  gm    0 lb 10 oz      55  %     FW Discordancy      0 \ 1 % ---------------------------------------------------------------------- OB History  Gravidity:    1         Term:   0        Prem:   0        SAB:   0  TOP:          0       Ectopic:  0        Living: 0 ---------------------------------------------------------------------- Gestational Age (Fetus A)  U/S Today:     19w 4d                                        EDD:   04/02/22  Best:  19w 1d     Det. By:  Previous Ultrasound      EDD:   04/05/22                                      (09/03/21) ---------------------------------------------------------------------- Anatomy (Fetus A)  Cranium:               Appears normal         Aortic Arch:            Appears normal  Cavum:                 Appears normal         Ductal Arch:            Appears normal  Ventricles:            Appears normal         Diaphragm:              Appears normal  Choroid Plexus:        Appears normal         Stomach:                Appears normal, left                                                                        sided   Cerebellum:            Appears normal         Abdomen:                Appears normal  Posterior Fossa:       Appears normal         Abdominal Wall:         Appears nml (cord                                                                        insert, abd wall)  Nuchal Fold:           Appears normal         Cord Vessels:           Not well visualized  Face:                  Appears normal         Kidneys:                Appear normal                         (orbits and profile)  Lips:                  Appears normal         Bladder:  Appears normal  Thoracic:              Appears normal         Spine:                  Appears normal  Heart:                 Not well visualized    Upper Extremities:      Appears normal  RVOT:                  Not well visualized    Lower Extremities:      Appears normal  LVOT:                  Not well visualized  Other:  Nasal bone, lenses, maxilla, mandible and falx visualized. Heels/feet          and open hands/5th digits visualized. Fetus appears to be female. ---------------------------------------------------------------------- Fetal Evaluation (Fetus B)  Num Of Fetuses:         2  Fetal Heart Rate(bpm):  130  Cardiac Activity:       Observed  Fetal Lie:              Upper Fetus  Presentation:           Variable  Placenta:               Posterior  P. Cord Insertion:      Visualized  Amniotic Fluid  AFI FV:      Within normal limits                              Largest Pocket(cm)                              4.99 ---------------------------------------------------------------------- Biometry (Fetus B)  BPD:      43.2  mm     G. Age:  19w 0d         47  %    CI:        71.56   %    70 - 86                                                          FL/HC:      18.3   %    16.1 - 18.3  HC:      162.6  mm     G. Age:  19w 0d         37  %    HC/AC:      1.17        1.09 - 1.39  AC:      139.3  mm     G. Age:  19w 2d         52  %    FL/BPD:     68.8   %  FL:       29.7   mm     G. Age:  19w 1d         44  %    FL/AC:      21.3   %  20 - 24  CER:      19.2  mm     G. Age:  18w 5d         25  %  NFT:       3.6  mm  LV:        5.1  mm  CM:        3.5  mm  Est. FW:     281

## 2021-12-08 ENCOUNTER — Ambulatory Visit: Payer: BC Managed Care – PPO | Attending: Obstetrics and Gynecology

## 2021-12-08 ENCOUNTER — Ambulatory Visit: Payer: BC Managed Care – PPO | Admitting: *Deleted

## 2021-12-08 ENCOUNTER — Other Ambulatory Visit: Payer: Self-pay | Admitting: *Deleted

## 2021-12-08 VITALS — BP 127/80 | HR 98

## 2021-12-08 DIAGNOSIS — O285 Abnormal chromosomal and genetic finding on antenatal screening of mother: Secondary | ICD-10-CM | POA: Diagnosis not present

## 2021-12-08 DIAGNOSIS — Z3A23 23 weeks gestation of pregnancy: Secondary | ICD-10-CM

## 2021-12-08 DIAGNOSIS — O30042 Twin pregnancy, dichorionic/diamniotic, second trimester: Secondary | ICD-10-CM

## 2021-12-08 DIAGNOSIS — Z3A24 24 weeks gestation of pregnancy: Secondary | ICD-10-CM | POA: Diagnosis not present

## 2021-12-08 DIAGNOSIS — Z362 Encounter for other antenatal screening follow-up: Secondary | ICD-10-CM

## 2021-12-08 DIAGNOSIS — Z148 Genetic carrier of other disease: Secondary | ICD-10-CM | POA: Diagnosis not present

## 2021-12-08 DIAGNOSIS — O0990 Supervision of high risk pregnancy, unspecified, unspecified trimester: Secondary | ICD-10-CM | POA: Insufficient documentation

## 2021-12-08 DIAGNOSIS — O321XX2 Maternal care for breech presentation, fetus 2: Secondary | ICD-10-CM | POA: Insufficient documentation

## 2021-12-08 DIAGNOSIS — O30049 Twin pregnancy, dichorionic/diamniotic, unspecified trimester: Secondary | ICD-10-CM | POA: Insufficient documentation

## 2021-12-08 DIAGNOSIS — D563 Thalassemia minor: Secondary | ICD-10-CM

## 2021-12-24 ENCOUNTER — Ambulatory Visit (INDEPENDENT_AMBULATORY_CARE_PROVIDER_SITE_OTHER): Payer: BC Managed Care – PPO | Admitting: Obstetrics

## 2021-12-24 ENCOUNTER — Encounter: Payer: Self-pay | Admitting: Obstetrics

## 2021-12-24 VITALS — BP 127/79 | HR 99 | Wt 179.0 lb

## 2021-12-24 DIAGNOSIS — O0990 Supervision of high risk pregnancy, unspecified, unspecified trimester: Secondary | ICD-10-CM

## 2021-12-24 DIAGNOSIS — O30049 Twin pregnancy, dichorionic/diamniotic, unspecified trimester: Secondary | ICD-10-CM

## 2021-12-24 DIAGNOSIS — J301 Allergic rhinitis due to pollen: Secondary | ICD-10-CM

## 2021-12-24 DIAGNOSIS — K219 Gastro-esophageal reflux disease without esophagitis: Secondary | ICD-10-CM

## 2021-12-24 MED ORDER — LORATADINE 10 MG PO TABS: 10.0000 mg | ORAL_TABLET | Freq: Every day | ORAL | 11 refills | Status: DC

## 2021-12-24 MED ORDER — OMEPRAZOLE 40 MG PO CPDR: 40.0000 mg | DELAYED_RELEASE_CAPSULE | Freq: Every day | ORAL | 5 refills | Status: DC

## 2021-12-24 MED ORDER — PNV-DHA+DOCUSATE 27-1.25-300 MG PO CAPS: 1.0000 | ORAL_CAPSULE | Freq: Every day | ORAL | 4 refills | Status: DC

## 2021-12-24 NOTE — Progress Notes (Signed)
Subjective:  ?Sylvia Walter is a 21 y.o. G1P0 at [redacted]w[redacted]d being seen today for ongoing prenatal care.  She is currently monitored for the following issues for this high-risk pregnancy and has Supervision of high-risk pregnancy; Dichorionic diamniotic twin pregnancy; Nausea and vomiting during pregnancy; and Alpha thalassemia silent carrier on their problem list. ? ?Patient reports heartburn and seasonal allergies .  Contractions: Not present. Vag. Bleeding: None.  Movement: Present. Denies leaking of fluid.  ? ?The following portions of the patient's history were reviewed and updated as appropriate: allergies, current medications, past family history, past medical history, past social history, past surgical history and problem list. Problem list updated. ? ?Objective:  ? ?Vitals:  ? 12/24/21 1458  ?BP: 127/79  ?Pulse: 99  ?Weight: 179 lb (81.2 kg)  ? ? ?Fetal Status: Fetal Heart Rate (bpm): 150/148   Movement: Present    ? ?General:  Alert, oriented and cooperative. Patient is in no acute distress.  ?Skin: Skin is warm and dry. No rash noted.   ?Cardiovascular: Normal heart rate noted  ?Respiratory: Normal respiratory effort, no problems with respiration noted  ?Abdomen: Soft, gravid, appropriate for gestational age. Pain/Pressure: Present     ?Pelvic:  Cervical exam deferred        ?Extremities: Normal range of motion.  Edema: None  ?Mental Status: Normal mood and affect. Normal behavior. Normal judgment and thought content.  ? ?Urinalysis:     ? ?Assessment and Plan:  ?Pregnancy: G1P0 at [redacted]w[redacted]d ? ?1. Supervision of high risk pregnancy, antepartum ?Rx: ?- Prenat-FeFum-DSS-FA-DHA w/o A (PNV-DHA+DOCUSATE) 27-1.25-300 MG CAPS; Take 1 capsule by mouth daily before breakfast.  Dispense: 90 capsule; Refill: 4 ? ?2. Dichorionic diamniotic twin pregnancy, antepartum ? ?3. GERD without esophagitis ?Rx: ?- omeprazole (PRILOSEC) 40 MG capsule; Take 1 capsule (40 mg total) by mouth daily.  Dispense: 30 capsule; Refill: 5 ? ?4.  Seasonal allergic rhinitis due to pollen ?Rx: ?- loratadine (CLARITIN) 10 MG tablet; Take 1 tablet (10 mg total) by mouth daily.  Dispense: 30 tablet; Refill: 11  ? ? ?Preterm labor symptoms and general obstetric precautions including but not limited to vaginal bleeding, contractions, leaking of fluid and fetal movement were reviewed in detail with the patient. ?Please refer to After Visit Summary for other counseling recommendations.  ? ?Return in about 3 weeks (around 01/14/2022) for [facility], 2 hour OGTT. ? ? ?Shelly Bombard, MD  ?12/24/21  ?

## 2021-12-24 NOTE — Progress Notes (Signed)
Pt in office ROB visit. She does not have any concerns today. ?

## 2021-12-29 ENCOUNTER — Telehealth: Payer: Self-pay

## 2021-12-29 ENCOUNTER — Ambulatory Visit: Payer: BC Managed Care – PPO | Admitting: Emergency Medicine

## 2021-12-29 VITALS — BP 122/78 | HR 93

## 2021-12-29 DIAGNOSIS — O0990 Supervision of high risk pregnancy, unspecified, unspecified trimester: Secondary | ICD-10-CM

## 2021-12-29 NOTE — Progress Notes (Signed)
Pt presents today for headaches. Pt states she usually wakes up with headaches, and they usually fade away. However she has had a headache since yesterday with nausea, and floaters in her eyes. Pt reports a hx of migraines. Does not take any medication for relief. ? ?Reports +fetal movement, denies abd/pelvic pain, vag bleeding, discharge, or LOF ?PT BP 122/78 ? ?BP cuff given to patient to take home. Pt instructed to take BP at home on Friday and send BP measures to [employer] app. If BP is 140/90+ she should call office. ? ?Pt given precautions to go to hospital.  ?

## 2021-12-29 NOTE — Telephone Encounter (Signed)
Returned call, pt states that she has been having really bad headaches lately and she does not have a home BP cuff. Pt has not tried anything to relieve headaches, advised pt that tylenol is safe. Also, advised pt to come into office today for BP check, pt agreed. ?

## 2022-01-05 ENCOUNTER — Other Ambulatory Visit: Payer: Self-pay | Admitting: *Deleted

## 2022-01-05 ENCOUNTER — Ambulatory Visit: Payer: BC Managed Care – PPO | Admitting: *Deleted

## 2022-01-05 ENCOUNTER — Ambulatory Visit: Payer: BC Managed Care – PPO | Attending: Obstetrics and Gynecology

## 2022-01-05 VITALS — BP 120/71 | HR 97

## 2022-01-05 DIAGNOSIS — O321XX2 Maternal care for breech presentation, fetus 2: Secondary | ICD-10-CM | POA: Insufficient documentation

## 2022-01-05 DIAGNOSIS — D563 Thalassemia minor: Secondary | ICD-10-CM

## 2022-01-05 DIAGNOSIS — O321XX1 Maternal care for breech presentation, fetus 1: Secondary | ICD-10-CM | POA: Insufficient documentation

## 2022-01-05 DIAGNOSIS — O30042 Twin pregnancy, dichorionic/diamniotic, second trimester: Secondary | ICD-10-CM

## 2022-01-05 DIAGNOSIS — O30043 Twin pregnancy, dichorionic/diamniotic, third trimester: Secondary | ICD-10-CM | POA: Diagnosis not present

## 2022-01-05 DIAGNOSIS — Z148 Genetic carrier of other disease: Secondary | ICD-10-CM | POA: Insufficient documentation

## 2022-01-05 DIAGNOSIS — O285 Abnormal chromosomal and genetic finding on antenatal screening of mother: Secondary | ICD-10-CM | POA: Diagnosis not present

## 2022-01-05 DIAGNOSIS — O0992 Supervision of high risk pregnancy, unspecified, second trimester: Secondary | ICD-10-CM | POA: Insufficient documentation

## 2022-01-05 DIAGNOSIS — O09893 Supervision of other high risk pregnancies, third trimester: Secondary | ICD-10-CM | POA: Insufficient documentation

## 2022-01-05 DIAGNOSIS — Z3A27 27 weeks gestation of pregnancy: Secondary | ICD-10-CM

## 2022-01-13 NOTE — Progress Notes (Deleted)
   PRENATAL VISIT NOTE  Subjective:  Sylvia Walter is a 21 y.o. G1P0 at [redacted]w[redacted]d being seen today for ongoing prenatal care.  She is currently monitored for the following issues for this {Blank single:19197::"high-risk","low-risk"} pregnancy and has Supervision of high-risk pregnancy; Dichorionic diamniotic twin pregnancy; Nausea and vomiting during pregnancy; and Alpha thalassemia silent carrier on their problem list.  Patient reports {sx:14538}.   .  .   . Denies leaking of fluid.   The following portions of the patient's history were reviewed and updated as appropriate: allergies, current medications, past family history, past medical history, past social history, past surgical history and problem list.   Objective:  There were no vitals filed for this visit.  Fetal Status:           General:  Alert, oriented and cooperative. Patient is in no acute distress.  Skin: Skin is warm and dry. No rash noted.   Cardiovascular: Normal heart rate noted  Respiratory: Normal respiratory effort, no problems with respiration noted  Abdomen: Soft, gravid, appropriate for gestational age.        Pelvic: {Blank single:19197::"Cervical exam performed in the presence of a chaperone","Cervical exam deferred"}        Extremities: Normal range of motion.     Mental Status: Normal mood and affect. Normal behavior. Normal judgment and thought content.   Assessment and Plan:  Pregnancy: G1P0 at [redacted]w[redacted]d There are no diagnoses linked to this encounter. {Blank single:19197::"Term","Preterm"} labor symptoms and general obstetric precautions including but not limited to vaginal bleeding, contractions, leaking of fluid and fetal movement were reviewed in detail with the patient. Please refer to After Visit Summary for other counseling recommendations.   No follow-ups on file.  Future Appointments  Date Time Provider [place]  01/15/2022  9:15 AM [facility] None  01/15/2022  9:35 AM Starr Lake, CNM [facility] None  02/03/2022  2:30 PM [facility] NURSE [facility]  02/03/2022  2:45 PM [facility] US5 [facility] [place]    Starr Lake, CNM

## 2022-01-15 ENCOUNTER — Encounter: Payer: BC Managed Care – PPO | Admitting: Student

## 2022-01-15 ENCOUNTER — Other Ambulatory Visit: Payer: BC Managed Care – PPO

## 2022-01-22 ENCOUNTER — Telehealth: Payer: Self-pay | Admitting: *Deleted

## 2022-01-22 NOTE — Telephone Encounter (Signed)
TC to pt to follow up on after hours call regarding "really weird abdominal pain that made her unable to walk," but that had resolved. No answer. Left VM for pt to call back for continued concerns.

## 2022-01-30 ENCOUNTER — Other Ambulatory Visit: Payer: BC Managed Care – PPO

## 2022-01-30 ENCOUNTER — Encounter: Payer: Self-pay | Admitting: Obstetrics

## 2022-01-30 ENCOUNTER — Ambulatory Visit (INDEPENDENT_AMBULATORY_CARE_PROVIDER_SITE_OTHER): Payer: BC Managed Care – PPO | Admitting: Obstetrics

## 2022-01-30 VITALS — BP 122/78 | HR 98 | Wt 192.0 lb

## 2022-01-30 DIAGNOSIS — O0993 Supervision of high risk pregnancy, unspecified, third trimester: Secondary | ICD-10-CM

## 2022-01-30 DIAGNOSIS — O30049 Twin pregnancy, dichorionic/diamniotic, unspecified trimester: Secondary | ICD-10-CM

## 2022-01-30 DIAGNOSIS — M549 Dorsalgia, unspecified: Secondary | ICD-10-CM

## 2022-01-30 MED ORDER — COMFORT FIT MATERNITY SUPP SM MISC: 0 refills | Status: DC

## 2022-01-30 NOTE — Progress Notes (Signed)
Pt presents for ROB and 2 gtt labs. Requests to wait next visit for Tdap.

## 2022-01-30 NOTE — Progress Notes (Signed)
Subjective:  Sylvia Walter is a 21 y.o. G1P0 at [redacted]w[redacted]d being seen today for ongoing prenatal care.  She is currently monitored for the following issues for this high-risk pregnancy and has Supervision of high-risk pregnancy; Dichorionic diamniotic twin pregnancy; Nausea and vomiting during pregnancy; and Alpha thalassemia silent carrier on their problem list.  Patient reports backache and heartburn.  Contractions: Not present. Vag. Bleeding: None.  Movement: Present. Denies leaking of fluid.   The following portions of the patient's history were reviewed and updated as appropriate: allergies, current medications, past family history, past medical history, past social history, past surgical history and problem list. Problem list updated.  Objective:   Vitals:   01/30/22 1044  BP: 122/78  Pulse: 98  Weight: 192 lb (87.1 kg)    Fetal Status:     Movement: Present     General:  Alert, oriented and cooperative. Patient is in no acute distress.  Skin: Skin is warm and dry. No rash noted.   Cardiovascular: Normal heart rate noted  Respiratory: Normal respiratory effort, no problems with respiration noted  Abdomen: Soft, gravid, appropriate for gestational age. Pain/Pressure: Absent     Pelvic:  Cervical exam deferred        Extremities: Normal range of motion.  Edema: Trace  Mental Status: Normal mood and affect. Normal behavior. Normal judgment and thought content.   Urinalysis:      Assessment and Plan:  Pregnancy: G1P0 at [redacted]w[redacted]d  1. Supervision of high risk pregnancy in third trimester Rx: - Glucose Tolerance, 2 Hours w/1 Hour - HIV Antibody (routine testing w rflx) - RPR - CBC - Ambulatory referral to [place]  2. Dichorionic diamniotic twin pregnancy, antepartum - ultrasound for interval growth every 3-4 weeks  3. Backache symptom Rx: - Elastic Bandages & Supports (COMFORT FIT MATERNITY SUPP SM) MISC; Wear as directed.  Dispense: 1 each; Refill:  0   Preterm labor symptoms and general obstetric precautions including but not limited to vaginal bleeding, contractions, leaking of fluid and fetal movement were reviewed in detail with the patient. Please refer to After Visit Summary for other counseling recommendations.   Return in about 2 weeks (around 02/13/2022) for [facility].   Shelly Bombard, MD  01/30/22

## 2022-01-31 ENCOUNTER — Other Ambulatory Visit: Payer: Self-pay | Admitting: Obstetrics

## 2022-01-31 DIAGNOSIS — O99019 Anemia complicating pregnancy, unspecified trimester: Secondary | ICD-10-CM

## 2022-01-31 DIAGNOSIS — O24410 Gestational diabetes mellitus in pregnancy, diet controlled: Secondary | ICD-10-CM

## 2022-01-31 LAB — HIV ANTIBODY (ROUTINE TESTING W REFLEX): HIV Screen 4th Generation wRfx: NONREACTIVE

## 2022-01-31 LAB — CBC
Hematocrit: 28.6 % — ABNORMAL LOW (ref 34.0–46.6)
Hemoglobin: 8.2 g/dL — ABNORMAL LOW (ref 11.1–15.9)
MCH: 19.3 pg — ABNORMAL LOW (ref 26.6–33.0)
MCHC: 28.7 g/dL — ABNORMAL LOW (ref 31.5–35.7)
MCV: 68 fL — ABNORMAL LOW (ref 79–97)
Platelets: 273 10*3/uL (ref 150–450)
RBC: 4.24 x10E6/uL (ref 3.77–5.28)
RDW: 18 % — ABNORMAL HIGH (ref 11.7–15.4)
WBC: 12.9 10*3/uL — ABNORMAL HIGH (ref 3.4–10.8)

## 2022-01-31 LAB — GLUCOSE TOLERANCE, 2 HOURS W/ 1HR
Glucose, 1 hour: 135 mg/dL (ref 70–179)
Glucose, 2 hour: 128 mg/dL (ref 70–152)
Glucose, Fasting: 97 mg/dL — ABNORMAL HIGH (ref 70–91)

## 2022-01-31 LAB — RPR: RPR Ser Ql: NONREACTIVE

## 2022-01-31 MED ORDER — IRON POLYSACCH CMPLX-B12-FA 150-0.025-1 MG PO CAPS: 1.0000 | ORAL_CAPSULE | ORAL | 5 refills | Status: DC

## 2022-02-01 ENCOUNTER — Encounter (HOSPITAL_COMMUNITY): Payer: Self-pay | Admitting: Obstetrics and Gynecology

## 2022-02-01 ENCOUNTER — Other Ambulatory Visit: Payer: Self-pay

## 2022-02-01 ENCOUNTER — Inpatient Hospital Stay (HOSPITAL_COMMUNITY)
Admission: AD | Admit: 2022-02-01 | Discharge: 2022-02-01 | Disposition: A | Discharge status: Home / Self Care | Payer: BC Managed Care – PPO | Attending: Obstetrics and Gynecology | Admitting: Obstetrics and Gynecology

## 2022-02-01 DIAGNOSIS — Z3A31 31 weeks gestation of pregnancy: Secondary | ICD-10-CM | POA: Diagnosis not present

## 2022-02-01 DIAGNOSIS — Z3689 Encounter for other specified antenatal screening: Secondary | ICD-10-CM

## 2022-02-01 DIAGNOSIS — O30043 Twin pregnancy, dichorionic/diamniotic, third trimester: Secondary | ICD-10-CM | POA: Diagnosis not present

## 2022-02-01 DIAGNOSIS — O4693 Antepartum hemorrhage, unspecified, third trimester: Secondary | ICD-10-CM | POA: Insufficient documentation

## 2022-02-01 DIAGNOSIS — N939 Abnormal uterine and vaginal bleeding, unspecified: Secondary | ICD-10-CM

## 2022-02-01 LAB — WET PREP, GENITAL
Clue Cells Wet Prep HPF POC: NONE SEEN
Sperm: NONE SEEN
Trich, Wet Prep: NONE SEEN
WBC, Wet Prep HPF POC: <10 (ref <10)
Yeast Wet Prep HPF POC: NONE SEEN

## 2022-02-01 NOTE — Discharge Instructions (Addendum)
You came to the MAU because you had one episode of vaginal spotting. Your symptoms had resolved by the time you got to MAU. We did a pelvic exam and there was no blood and we did swabs and so far there was no infection. We will reach out to you if there are any abnormal results.   We monitored your babies on the monitor and they looked good for their age. We checked your cervix and it was closed and you had very infrequent contractions on monitor so we confirmed you are not in labor.  Please seek medical care if you have any more vaginal bleeding especially if you have clots or the blood is soaking your pad or underwear. Also if you have leaking of fluid or don't feel your baby move or have more frequent contractions.

## 2022-02-01 NOTE — MAU Provider Note (Signed)
Chief Complaint:  Vaginal Bleeding   None    HPI  HPI: Sylvia Walter is a 21 y.o. G1P0 at 76w0dwith di/di twin gestation who presents to maternity admissions reporting one episode of spotting. Has not needed to use pad.  She reports had spotting when wiping after peeing. Only one time. Wiped again and nothing before. Denies pain.   Last sex yesterday. Denies dysuria, pyuria.   She reports good fetal movement, denies LOF, vaginal bleeding, vaginal itching/burning, urinary symptoms, h/a, dizziness, n/v, diarrhea, constipation or fever/chills.  She denies headache, visual changes or RUQ abdominal pain.   Past Medical History: Past Medical History:  Diagnosis Date   ADHD    Alpha thalassemia silent carrier 10/25/2021    Past obstetric history: OB History  Gravida Para Term Preterm AB Living  1         0  SAB IAB Ectopic Multiple Live Births          0    # Outcome Date GA Lbr Len/2nd Weight Sex Delivery Anes PTL Lv  1 Current             Past Surgical History: Past Surgical History:  Procedure Laterality Date   NO PAST SURGERIES      Family History: Family History  Problem Relation Age of Onset   Hypertension Mother     Social History: Social History   Tobacco Use   Smoking status: Never   Smokeless tobacco: Never  Vaping Use   Vaping Use: Never used  Substance Use Topics   Alcohol use: Not Currently    Comment: occ, prior to pregnancy   Drug use: Never    Allergies:  Allergies  Allergen Reactions   Other Other (See Comments)    Grass/ pecans    Meds:  Medications Prior to Admission  Medication Sig Dispense Refill Last Dose   ASPIRIN 81 PO Take by mouth.      Blood Pressure Monitoring (BLOOD PRESSURE KIT) DEVI 1 kit by Does not apply route once a week. (Patient not taking: Reported on 12/24/2021) 1 each 0    Elastic Bandages & Supports (COMFORT FIT MATERNITY SUPP SM) MISC Wear as directed. 1 each 0    Iron Polysacch Cmplx-B12-FA 150-0.025-1 MG  CAPS Take 1 capsule by mouth every other day. 30 capsule 5    loratadine (CLARITIN) 10 MG tablet Take 1 tablet (10 mg total) by mouth daily. 30 tablet 11    omeprazole (PRILOSEC) 40 MG capsule Take 1 capsule (40 mg total) by mouth daily. (Patient not taking: Reported on 01/05/2022) 30 capsule 5    ondansetron (ZOFRAN-ODT) 4 MG disintegrating tablet 4 mg 2 (two) times daily. (Patient not taking: Reported on 12/24/2021)      Prenat-FeFum-DSS-FA-DHA w/o A (PNV-DHA+DOCUSATE) 27-1.25-300 MG CAPS Take 1 capsule by mouth daily before breakfast. 90 capsule 4    vitamin B-12 (CYANOCOBALAMIN) 500 MCG tablet Take 500 mcg by mouth daily.       I have reviewed patient's Past Medical Hx, Surgical Hx, Family Hx, Social Hx, medications and allergies.   ROS:  Review of Systems  Constitutional:  Negative for fever.  Gastrointestinal:  Negative for abdominal pain.  Endocrine: Negative for polyuria.  Genitourinary:  Negative for dyspareunia, dysuria, flank pain, frequency, hematuria, pelvic pain, vaginal discharge and vaginal pain.  Musculoskeletal:  Negative for back pain.  Neurological:  Negative for light-headedness.  Other systems negative  Physical Exam  Patient Vitals for the past 24 hrs:  BP Temp Temp src Pulse Resp SpO2 Height Weight  02/01/22 0912 116/64 -- -- 87 16 -- -- --  02/01/22 0751 124/73 98.9 F (37.2 C) Oral 91 16 98 % -- --  02/01/22 0746 -- -- -- -- -- -- _0  (1.702 m) 88.1 kg    Constitutional: Well-developed, well-nourished female in no acute distress.  Cardiovascular: normal rate  Respiratory: normal effort GI: Abd soft, non-tender, gravid appropriate for gestational age.   No rebound or guarding. MS: Extremities nontender, no edema, normal ROM Neurologic: Alert and oriented x 4.  GU:   PELVIC EXAM: Cervix pink, visually closed, without lesion, scant mucousy discharge, vaginal walls and external genitalia normal. No blood in vault. No blood from cervix.   Bimanual exam:  Cervix firm and closed, posterior, neg CMT,      FHT Twin A Baseline 140 , moderate variability, accelerations present, no decelerations (with more than three 10x10 accels - appropriate for GA) FHT Twin B Baseline 135-140 , moderate variability, accelerations present, no decelerations Contractions: q 10-20 mins Irregular  Rare   Labs: Results for orders placed or performed during the hospital encounter of 02/01/22 (from the past 24 hour(s))  Wet prep, genital     Status: None   Collection Time: 02/01/22  8:02 AM  Result Value Ref Range   Yeast Wet Prep HPF POC NONE SEEN NONE SEEN   Trich, Wet Prep NONE SEEN NONE SEEN   Clue Cells Wet Prep HPF POC NONE SEEN NONE SEEN   WBC, Wet Prep HPF POC <10 <10   Sperm NONE SEEN    B/Positive/-- (01/25 1414) Wet prep negative GC/CT pending   Imaging:  None today  MAU Course/MDM: I have reviewed the triage vital signs and the nursing notes.   Pertinent labs & imaging results that were available during my care of the patient were reviewed by me and considered in my medical decision making (see chart for details). I have reviewed her medical records including past results, notes and treatments.   I have ordered labs and reviewed results.  NST reviewed- reactive and appropriate for GA   Assessment: 1. Vaginal spotting   2. [redacted] weeks gestation of pregnancy   3. Dichorionic diamniotic twin pregnancy in third trimester   4. NST (non-stress test) reactive    Vaginal spotting 21 y.o. G1P0 at 26w0dwith di/di twin gestation who presents to maternity admissions reporting one episode of spotting. On exam well appearing, no acute distress. No blood on speculum exam including no blood in vault and no blood in os. Wet prep normal and GC/C sent.  Patient initially unable to give urine sample. Collected immediately prior to discharge and sent to lab with plan to reach out to patient if abnormal.   Given no evidence of bleeding while here and reactive  NST and only one isolated episode of spotting only with wiping a few hours ago  low suspicion for placental abruption. Cervix closed and very irregular contractions therefore no evidence of preterm labor. Spotting possibly related to recent intercourse.  Ok to discharge home with labor and bleeding precautions.  2. Reactive NST Next [place] office on 5/30 (in 2 days)  Plan: Discharge home Labor precautions and fetal kick counts Follow up in Office for prenatal visits and recheck -next [place] on 5/30 and next prenatal visit on 6/12   Pt stable at time of discharge.  ARenard Matter MD, MPH OB Fellow, Faculty Practice

## 2022-02-01 NOTE — MAU Note (Signed)
Sylvia Walter is a 21 y.o. at [redacted]w[redacted]d here in MAU reporting: woke up this AM with spotting when she used the bathroom. Checked for more spotting right before she left and did not see anymore. No clots. No pain. +FM. Last IC was yesterday.  Onset of complaint: today  Pain score: 0/10  Vitals:   02/01/22 0751  BP: 124/73  Pulse: 91  Resp: 16  Temp: 98.9 F (37.2 C)  SpO2: 98%     FHT: Baby A 139, Baby B 130  Lab orders placed from triage: none

## 2022-02-03 ENCOUNTER — Ambulatory Visit: Payer: BC Managed Care – PPO | Attending: Obstetrics and Gynecology

## 2022-02-03 ENCOUNTER — Ambulatory Visit: Payer: BC Managed Care – PPO | Attending: Obstetrics and Gynecology | Admitting: Obstetrics and Gynecology

## 2022-02-03 ENCOUNTER — Ambulatory Visit: Payer: BC Managed Care – PPO | Admitting: *Deleted

## 2022-02-03 ENCOUNTER — Other Ambulatory Visit: Payer: Self-pay | Admitting: *Deleted

## 2022-02-03 VITALS — BP 145/73

## 2022-02-03 DIAGNOSIS — D563 Thalassemia minor: Secondary | ICD-10-CM | POA: Diagnosis present

## 2022-02-03 DIAGNOSIS — Z3A31 31 weeks gestation of pregnancy: Secondary | ICD-10-CM | POA: Diagnosis not present

## 2022-02-03 DIAGNOSIS — O0993 Supervision of high risk pregnancy, unspecified, third trimester: Secondary | ICD-10-CM | POA: Diagnosis present

## 2022-02-03 DIAGNOSIS — O30043 Twin pregnancy, dichorionic/diamniotic, third trimester: Secondary | ICD-10-CM

## 2022-02-03 DIAGNOSIS — O24419 Gestational diabetes mellitus in pregnancy, unspecified control: Secondary | ICD-10-CM

## 2022-02-03 DIAGNOSIS — O30042 Twin pregnancy, dichorionic/diamniotic, second trimester: Secondary | ICD-10-CM

## 2022-02-03 DIAGNOSIS — O285 Abnormal chromosomal and genetic finding on antenatal screening of mother: Secondary | ICD-10-CM | POA: Diagnosis not present

## 2022-02-03 LAB — GC/CHLAMYDIA PROBE AMP (~~LOC~~) NOT AT ARMC
Chlamydia: NEGATIVE
Comment: NEGATIVE
Comment: NORMAL
Neisseria Gonorrhea: NEGATIVE

## 2022-02-03 MED ORDER — ACCU-CHEK GUIDE VI STRP: ORAL_STRIP | 5 refills | Status: DC

## 2022-02-03 MED ORDER — ACCU-CHEK GUIDE W/DEVICE KIT: PACK | 0 refills | Status: DC

## 2022-02-03 MED ORDER — ACCU-CHEK SOFTCLIX LANCETS MISC: 5 refills | Status: DC

## 2022-02-03 NOTE — Progress Notes (Signed)
Maternal-Fetal Medicine   Name: Sylvia Walter DOB: 19-Sep-2000 MRN: [id number] Referring Provider: Coral Ceo, MD  I had the pleasure of [facility]. Geck [facility].  She is G1 P0 at 31w 2d gestation and has dichorionic/diamniotic twin pregnancy.  She has a new diagnosis of gestational diabetes.  She is yet to start checking her blood glucose.  Pressure today at her office is 145/73 mmHg.    Ultrasound Dichorionic-diamniotic twin pregnancy  Twin A: Lower fetus, breech presentation, anterior placenta.  Amniotic fluid is normal and good fetal activity seen.  Fetal growth is appropriate for gestational age.  Twin B: Upper fetus, cephalic presentation, posterior placenta.  Amniotic fluid is normal and good fetal activity seen.  Fetal growth is appropriate for gestational age.  Growth discordancy: 2%.  Gestational diabetes I explained the diagnosis of gestational diabetes.  I emphasized the importance of good blood glucose control to prevent adverse fetal or neonatal outcomes.  I discussed blood glucose normal values. I encouraged her to check her blood glucose regularly. Possible complications of gestational diabetes include fetal macrosomia, shoulder dystocia and birth injuries, stillbirth (in poorly controlled diabetes) and neonatal respiratory syndrome and other complications.  In about 85% of cases, gestational diabetes is well controlled by diet alone.  Exercise reduces the need for insulin.  Medical treatment includes oral hypoglycemics or insulin.  Timing of delivery: In well-controlled diabetes on diet, patient can be delivered at 55- or 40-weeks' gestation. Vaginal delivery is not contraindicated. Type 2 diabetes develops in up to 50% of women with GDM. I recommend postpartum screening with 75-g glucose load at 6 to 12 weeks after delivery. Recommendations -Appointment was made for her to return in 2 weeks for BPP and then weekly BPP till  delivery. -Delivery at [redacted] weeks gestation provided her diabetes is well controlled.  If suboptimal control of diabetes is seen, delivery at [redacted] weeks gestation should be considered. -Postpartum screening for type 2 diabetes  Thank you for consultation.  If you have any questions or concerns, please contact me the [facility].  Consultation including face-to-face (more than 50%) counseling 30 minutes.

## 2022-02-03 NOTE — Progress Notes (Signed)
Glucose testing supplies ordered.

## 2022-02-04 ENCOUNTER — Other Ambulatory Visit: Payer: Self-pay | Admitting: *Deleted

## 2022-02-04 DIAGNOSIS — O30043 Twin pregnancy, dichorionic/diamniotic, third trimester: Secondary | ICD-10-CM

## 2022-02-04 NOTE — Progress Notes (Unsigned)
U

## 2022-02-16 ENCOUNTER — Encounter: Payer: Self-pay | Admitting: Advanced Practice Midwife

## 2022-02-16 ENCOUNTER — Ambulatory Visit (INDEPENDENT_AMBULATORY_CARE_PROVIDER_SITE_OTHER): Payer: BC Managed Care – PPO | Admitting: Advanced Practice Midwife

## 2022-02-16 VITALS — BP 136/74 | HR 113 | Wt 201.0 lb

## 2022-02-16 DIAGNOSIS — R030 Elevated blood-pressure reading, without diagnosis of hypertension: Secondary | ICD-10-CM

## 2022-02-16 DIAGNOSIS — O30049 Twin pregnancy, dichorionic/diamniotic, unspecified trimester: Secondary | ICD-10-CM

## 2022-02-16 DIAGNOSIS — O0993 Supervision of high risk pregnancy, unspecified, third trimester: Secondary | ICD-10-CM

## 2022-02-16 DIAGNOSIS — O24410 Gestational diabetes mellitus in pregnancy, diet controlled: Secondary | ICD-10-CM

## 2022-02-16 NOTE — Progress Notes (Signed)
Patient presents for ROB. Patient states that she has not started checking her blood sugars yet and is scheduled for diabetes education on Wed 6/14. Patient also states that she had an elevated bp yesterday evening 158/90s. Denies having any headaches, no visual disturbances, and LUQ pain. No other concerns.

## 2022-02-16 NOTE — Progress Notes (Signed)
PRENATAL VISIT NOTE  Subjective:  Sylvia Walter is a 21 y.o. G1P0 at [redacted]w[redacted]d being seen today for ongoing prenatal care.  She is currently monitored for the following issues for this high-risk pregnancy and has Supervision of high-risk pregnancy; Dichorionic diamniotic twin pregnancy; Nausea and vomiting during pregnancy; and Alpha thalassemia silent carrier on their problem list.  Patient reports no complaints.  Contractions: Not present. Vag. Bleeding: None.  Movement: Present. Denies leaking of fluid.   The following portions of the patient's history were reviewed and updated as appropriate: allergies, current medications, past family history, past medical history, past social history, past surgical history and problem list.   Objective:   Vitals:   02/16/22 1429  BP: 136/74  Pulse: (!) 113  Weight: 201 lb (91.2 kg)    Fetal Status: Fetal Heart Rate (bpm): 145/150   Movement: Present     General:  Alert, oriented and cooperative. Patient is in no acute distress.  Skin: Skin is warm and dry. No rash noted.   Cardiovascular: Normal heart rate noted  Respiratory: Normal respiratory effort, no problems with respiration noted  Abdomen: Soft, gravid, appropriate for gestational age.  Pain/Pressure: Present     Pelvic: Cervical exam deferred        Extremities: Normal range of motion.  Edema: Moderate pitting, indentation subsides rapidly  Mental Status: Normal mood and affect. Normal behavior. Normal judgment and thought content.   Assessment and Plan:  Pregnancy: G1P0 at [redacted]w[redacted]d 1. Supervision of high risk pregnancy in third trimester --Anticipatory guidance about next visits/weeks of pregnancy given.  --Questions about delivery. Per MFM, delivery at 38 weeks if GDM remains well controlled and no GHTN.  If GHTN or blood glucose concerns, delivery at 37 weeks.   2. Dichorionic diamniotic twin pregnancy, antepartum --Normal growth, antenatal testing with MFM  3. Elevated blood  pressure reading in office without diagnosis of hypertension --Pt with elevated BP x 1 at office visit on 02/03/22, BP 145/73. --No additional elevated BPs until BP at home last night 150s/90s.  BP wnl in office today. --No s/sx of PEC --PEC labs today and BP check tomorrow (pt has appt with [facility] so BP added to appt).  --Reviewed s/sx of PEC/reasons to seek care  4. GDM (gestational diabetes mellitus), class A1 --Has not started taking sugars yet.  Has class scheduled and picked up meter today. --Reviewed how to use meter, and how often/when to take glucose. Brief review of GDM diet.  Pt to start blood sugars as soon as possible, find out more at class and continue checking. --F/U in 2 weeks   Preterm labor symptoms and general obstetric precautions including but not limited to vaginal bleeding, contractions, leaking of fluid and fetal movement were reviewed in detail with the patient. Please refer to After Visit Summary for other counseling recommendations.   Return in about 2 weeks (around 03/02/2022) for [facility]OB appt in 2 weeks, BP check tomorrow, 6/13 with [facility] visit.  Future Appointments  Date Time Provider [place]  02/17/2022  7:30 AM [facility] NURSE [facility]  02/17/2022  7:45 AM [facility] US6 [facility]  02/17/2022  2:30 PM Lynnea Ferrier, LCSW [facility] None  02/17/2022  3:00 PM [place] None  02/18/2022  9:00 AM [facility] GDM CLASS [facility] NDM  02/25/2022  1:30 PM [facility] NURSE [facility]  02/25/2022  1:45 PM [facility] US4 [facility]  03/03/2022 12:45 PM [facility] NURSE [facility]  03/03/2022  1:00 PM [facility] US1 [facility]  [facility]  03/04/2022  3:30 PM Constant, Peggy, MD [place] None  03/11/2022 12:45 PM [facility] NURSE [facility]  03/11/2022  1:00 PM [facility] US1 [facility] [place]    Fatima Blank, CNM

## 2022-02-17 ENCOUNTER — Encounter: Payer: Self-pay | Admitting: *Deleted

## 2022-02-17 ENCOUNTER — Ambulatory Visit (INDEPENDENT_AMBULATORY_CARE_PROVIDER_SITE_OTHER): Payer: BC Managed Care – PPO

## 2022-02-17 ENCOUNTER — Ambulatory Visit: Payer: BC Managed Care – PPO | Attending: Obstetrics and Gynecology

## 2022-02-17 ENCOUNTER — Ambulatory Visit (INDEPENDENT_AMBULATORY_CARE_PROVIDER_SITE_OTHER): Payer: BC Managed Care – PPO | Admitting: Licensed Clinical Social Worker

## 2022-02-17 ENCOUNTER — Ambulatory Visit: Payer: BC Managed Care – PPO | Admitting: *Deleted

## 2022-02-17 VITALS — BP 122/77 | HR 97

## 2022-02-17 VITALS — BP 129/78 | HR 76

## 2022-02-17 DIAGNOSIS — O0993 Supervision of high risk pregnancy, unspecified, third trimester: Secondary | ICD-10-CM | POA: Insufficient documentation

## 2022-02-17 DIAGNOSIS — Z3A33 33 weeks gestation of pregnancy: Secondary | ICD-10-CM

## 2022-02-17 DIAGNOSIS — O285 Abnormal chromosomal and genetic finding on antenatal screening of mother: Secondary | ICD-10-CM

## 2022-02-17 DIAGNOSIS — O30043 Twin pregnancy, dichorionic/diamniotic, third trimester: Secondary | ICD-10-CM | POA: Diagnosis present

## 2022-02-17 DIAGNOSIS — O99013 Anemia complicating pregnancy, third trimester: Secondary | ICD-10-CM | POA: Insufficient documentation

## 2022-02-17 DIAGNOSIS — D563 Thalassemia minor: Secondary | ICD-10-CM | POA: Diagnosis not present

## 2022-02-17 DIAGNOSIS — O24410 Gestational diabetes mellitus in pregnancy, diet controlled: Secondary | ICD-10-CM | POA: Diagnosis not present

## 2022-02-17 DIAGNOSIS — F4322 Adjustment disorder with anxiety: Secondary | ICD-10-CM

## 2022-02-17 LAB — CBC
Hematocrit: 29.3 % — ABNORMAL LOW (ref 34.0–46.6)
Hemoglobin: 8.4 g/dL — ABNORMAL LOW (ref 11.1–15.9)
MCH: 18.5 pg — ABNORMAL LOW (ref 26.6–33.0)
MCHC: 28.7 g/dL — ABNORMAL LOW (ref 31.5–35.7)
MCV: 65 fL — ABNORMAL LOW (ref 79–97)
NRBC: 1 % — ABNORMAL HIGH (ref 0–0)
Platelets: 260 10*3/uL (ref 150–450)
RBC: 4.54 x10E6/uL (ref 3.77–5.28)
RDW: 19.2 % — ABNORMAL HIGH (ref 11.7–15.4)
WBC: 12.7 10*3/uL — ABNORMAL HIGH (ref 3.4–10.8)

## 2022-02-17 LAB — COMPREHENSIVE METABOLIC PANEL
ALT: 15 IU/L (ref 0–32)
AST: 26 IU/L (ref 0–40)
Albumin/Globulin Ratio: 1.4 (ref 1.2–2.2)
Albumin: 3.3 g/dL — ABNORMAL LOW (ref 3.9–5.0)
Alkaline Phosphatase: 118 IU/L (ref 44–121)
BUN/Creatinine Ratio: 9 (ref 9–23)
BUN: 5 mg/dL — ABNORMAL LOW (ref 6–20)
Bilirubin Total: <0.2 mg/dL (ref 0.0–1.2)
CO2: 17 mmol/L — ABNORMAL LOW (ref 20–29)
Calcium: 8.7 mg/dL (ref 8.7–10.2)
Chloride: 106 mmol/L (ref 96–106)
Creatinine, Ser: 0.53 mg/dL — ABNORMAL LOW (ref 0.57–1.00)
Globulin, Total: 2.3 g/dL (ref 1.5–4.5)
Glucose: 87 mg/dL (ref 70–99)
Potassium: 4.4 mmol/L (ref 3.5–5.2)
Sodium: 137 mmol/L (ref 134–144)
Total Protein: 5.6 g/dL — ABNORMAL LOW (ref 6.0–8.5)
eGFR: 134 mL/min/{1.73_m2} (ref >59)

## 2022-02-17 NOTE — Progress Notes (Signed)
..  Subjective:  Sylvia Walter is a pregnant 21 y.o. female at 57 weeks and 2 days here for BP check.   Hypertension ROS: no TIA's, no chest pain on exertion, no dyspnea on exertion, and no swelling of ankles.    Objective:  BP 122/77   Pulse 97   Appearance alert, well appearing, and in no distress. General exam BP noted to be well controlled today in office.    Assessment:   Blood Pressure well controlled.   Plan:  Current treatment plan is effective, no change in therapy.. Advised of abnormal signs/symptoms and to monitor BP at home

## 2022-02-18 ENCOUNTER — Encounter: Payer: Self-pay | Admitting: Registered"

## 2022-02-18 ENCOUNTER — Other Ambulatory Visit: Payer: Self-pay | Admitting: Advanced Practice Midwife

## 2022-02-18 ENCOUNTER — Encounter: Payer: BC Managed Care – PPO | Attending: Obstetrics | Admitting: Registered"

## 2022-02-18 DIAGNOSIS — O24419 Gestational diabetes mellitus in pregnancy, unspecified control: Secondary | ICD-10-CM | POA: Diagnosis present

## 2022-02-18 DIAGNOSIS — D649 Anemia, unspecified: Secondary | ICD-10-CM

## 2022-02-18 DIAGNOSIS — O99019 Anemia complicating pregnancy, unspecified trimester: Secondary | ICD-10-CM

## 2022-02-18 LAB — PROTEIN / CREATININE RATIO, URINE
Creatinine, Urine: 69.7 mg/dL
Protein, Ur: 10.3 mg/dL
Protein/Creat Ratio: 148 mg/g creat (ref 0–200)

## 2022-02-18 NOTE — Progress Notes (Signed)
Orders placed for outpatient IV Venofer at IV infusion center

## 2022-02-18 NOTE — Progress Notes (Signed)
Patient was seen on 02/18/2022 for Gestational Diabetes self-management class at [facility]. The following learning objectives were met by the patient during this course:  States the definition of Gestational Diabetes States why dietary management is important in controlling blood glucose Describes the effects each nutrient has on blood glucose levels Demonstrates ability to create a balanced meal plan Demonstrates carbohydrate counting  States when to check blood glucose levels Demonstrates proper blood glucose monitoring techniques States the effect of stress and exercise on blood glucose levels States the importance of limiting caffeine and abstaining from alcohol and smoking  Blood glucose monitor given: Patient has meter and brought to class for instruction. FBS: 91 mg/dL  Patient instructed to monitor glucose levels: FBS: 60 - <95; 1 hour: <140; 2 hour: <120  Patient received handouts: Nutrition Diabetes and Pregnancy, including carb counting list  Patient will be seen for follow-up as needed.

## 2022-02-18 NOTE — BH Specialist Note (Signed)
Integrated Behavioral Health Initial In-Person Visit  MRN: [id number] Name: Sylvia Walter  Number of Integrated Behavioral Health Clinician visits: 1- Initial Visit  Session Start time: 1440    Session End time: 1522  Total time in minutes: 42 In person at [facility]  Types of Service: Individual psychotherapy  Interpretor:No. Interpretor Name and Language: none    Warm Hand Off Completed.        Subjective: Sylvia Walter is a 21 y.o. female accompanied by Mother Patient was referred by L Leftwich-Kirby for anxious mood. Patient reports the following symptoms/concerns: anxious mood  Duration of problem: 3 months ; Severity of problem: mild  Objective: Mood: Anxious and Affect: Appropriate and Constricted Risk of harm to self or others: No plan to harm self or others  Life Context: Family and Social: Lives with mother and younger brother  School/Work: Engineer, manufacturing  Self-Care: reading  Life Changes: New pregnancy   Patient and/or Family's Strengths/Protective Factors: Concrete supports in place (healthy food, safe environments, etc.)  Goals Addressed: Patient will: Reduce symptoms of: anxiety Increase knowledge and/or ability of: coping skills  Demonstrate ability to: Increase healthy adjustment to current life circumstances  Progress towards Goals: Ongoing  Interventions: Interventions utilized: Supportive Counseling  Standardized Assessments completed: PHQ 9  Patient and/or Family Response: [name] responded well to visit    Assessment: Patient currently experiencing adjustment disorder with anxious mood.   Patient may benefit from integrated mood .  Plan: Follow up with behavioral health clinician on : 03/04/2022 Behavioral recommendations: prioritize rest, delegate task to prevent burnout, engage in self care before delivery to reduce anxious mood  Referral(s): Integrated [employer] (In Clinic) "From scale of 1-10, how likely are you  to follow plan?":    Gwyndolyn Saxon, LCSW

## 2022-02-25 ENCOUNTER — Ambulatory Visit: Payer: BC Managed Care – PPO | Admitting: *Deleted

## 2022-02-25 ENCOUNTER — Ambulatory Visit: Payer: BC Managed Care – PPO | Attending: Obstetrics and Gynecology

## 2022-02-25 VITALS — BP 132/81 | HR 104

## 2022-02-25 DIAGNOSIS — O285 Abnormal chromosomal and genetic finding on antenatal screening of mother: Secondary | ICD-10-CM

## 2022-02-25 DIAGNOSIS — D563 Thalassemia minor: Secondary | ICD-10-CM | POA: Diagnosis not present

## 2022-02-25 DIAGNOSIS — O0993 Supervision of high risk pregnancy, unspecified, third trimester: Secondary | ICD-10-CM | POA: Diagnosis present

## 2022-02-25 DIAGNOSIS — O24410 Gestational diabetes mellitus in pregnancy, diet controlled: Secondary | ICD-10-CM

## 2022-02-25 DIAGNOSIS — O30043 Twin pregnancy, dichorionic/diamniotic, third trimester: Secondary | ICD-10-CM | POA: Insufficient documentation

## 2022-02-25 DIAGNOSIS — Z3A34 34 weeks gestation of pregnancy: Secondary | ICD-10-CM

## 2022-02-26 ENCOUNTER — Other Ambulatory Visit: Payer: Self-pay | Admitting: *Deleted

## 2022-02-26 DIAGNOSIS — O30043 Twin pregnancy, dichorionic/diamniotic, third trimester: Secondary | ICD-10-CM

## 2022-02-26 DIAGNOSIS — O24410 Gestational diabetes mellitus in pregnancy, diet controlled: Secondary | ICD-10-CM

## 2022-03-02 ENCOUNTER — Encounter (HOSPITAL_COMMUNITY)
Admission: RE | Admit: 2022-03-02 | Discharge: 2022-03-02 | Disposition: A | Discharge status: Home / Self Care | Payer: BC Managed Care – PPO | Source: Ambulatory Visit | Attending: Advanced Practice Midwife | Admitting: Advanced Practice Midwife

## 2022-03-02 DIAGNOSIS — O99019 Anemia complicating pregnancy, unspecified trimester: Secondary | ICD-10-CM | POA: Insufficient documentation

## 2022-03-02 DIAGNOSIS — D649 Anemia, unspecified: Secondary | ICD-10-CM | POA: Insufficient documentation

## 2022-03-02 MED ORDER — SODIUM CHLORIDE 0.9 % IV BOLUS: 500.0000 mL | Freq: Once | INTRAVENOUS | Status: DC | PRN

## 2022-03-02 MED ORDER — METHYLPREDNISOLONE SODIUM SUCC 125 MG IJ SOLR: 125.0000 mg | Freq: Once | INTRAMUSCULAR | Status: DC | PRN

## 2022-03-02 MED ORDER — SODIUM CHLORIDE 0.9 % IV SOLN: INTRAVENOUS | Status: DC | PRN

## 2022-03-02 MED ORDER — EPINEPHRINE PF 1 MG/ML IJ SOLN: 0.3000 mg | Freq: Once | INTRAMUSCULAR | Status: DC | PRN

## 2022-03-02 MED ORDER — DIPHENHYDRAMINE HCL 50 MG/ML IJ SOLN: 25.0000 mg | Freq: Once | INTRAMUSCULAR | Status: DC | PRN

## 2022-03-02 MED ORDER — ALBUTEROL SULFATE (2.5 MG/3ML) 0.083% IN NEBU: 2.5000 mg | INHALATION_SOLUTION | Freq: Once | RESPIRATORY_TRACT | Status: DC | PRN

## 2022-03-02 MED ORDER — SODIUM CHLORIDE 0.9 % IV SOLN
500.0000 mg | Freq: Once | INTRAVENOUS | Status: AC
  Administered 2022-03-02: 500 mg via INTRAVENOUS
  Filled 2022-03-02: qty 25

## 2022-03-03 ENCOUNTER — Ambulatory Visit: Payer: BC Managed Care – PPO | Admitting: *Deleted

## 2022-03-03 ENCOUNTER — Ambulatory Visit: Payer: BC Managed Care – PPO | Attending: Obstetrics and Gynecology

## 2022-03-03 VITALS — BP 132/76 | HR 105

## 2022-03-03 DIAGNOSIS — O24410 Gestational diabetes mellitus in pregnancy, diet controlled: Secondary | ICD-10-CM | POA: Diagnosis not present

## 2022-03-03 DIAGNOSIS — O285 Abnormal chromosomal and genetic finding on antenatal screening of mother: Secondary | ICD-10-CM | POA: Diagnosis not present

## 2022-03-03 DIAGNOSIS — D563 Thalassemia minor: Secondary | ICD-10-CM

## 2022-03-03 DIAGNOSIS — O30043 Twin pregnancy, dichorionic/diamniotic, third trimester: Secondary | ICD-10-CM | POA: Insufficient documentation

## 2022-03-03 DIAGNOSIS — O0993 Supervision of high risk pregnancy, unspecified, third trimester: Secondary | ICD-10-CM

## 2022-03-03 DIAGNOSIS — Z3A35 35 weeks gestation of pregnancy: Secondary | ICD-10-CM

## 2022-03-04 ENCOUNTER — Encounter: Payer: Self-pay | Admitting: Obstetrics and Gynecology

## 2022-03-04 ENCOUNTER — Ambulatory Visit (INDEPENDENT_AMBULATORY_CARE_PROVIDER_SITE_OTHER): Payer: BC Managed Care – PPO | Admitting: Obstetrics and Gynecology

## 2022-03-04 ENCOUNTER — Ambulatory Visit: Payer: BC Managed Care – PPO | Admitting: Licensed Clinical Social Worker

## 2022-03-04 ENCOUNTER — Other Ambulatory Visit (HOSPITAL_COMMUNITY)
Admission: RE | Admit: 2022-03-04 | Discharge: 2022-03-04 | Disposition: A | Discharge status: Home / Self Care | Payer: BC Managed Care – PPO | Source: Ambulatory Visit | Attending: Obstetrics and Gynecology | Admitting: Obstetrics and Gynecology

## 2022-03-04 VITALS — BP 124/75 | HR 102 | Wt 214.2 lb

## 2022-03-04 DIAGNOSIS — O0993 Supervision of high risk pregnancy, unspecified, third trimester: Secondary | ICD-10-CM

## 2022-03-04 DIAGNOSIS — Z23 Encounter for immunization: Secondary | ICD-10-CM | POA: Diagnosis not present

## 2022-03-04 DIAGNOSIS — O24419 Gestational diabetes mellitus in pregnancy, unspecified control: Secondary | ICD-10-CM | POA: Insufficient documentation

## 2022-03-04 DIAGNOSIS — O30043 Twin pregnancy, dichorionic/diamniotic, third trimester: Secondary | ICD-10-CM

## 2022-03-04 NOTE — Addendum Note (Signed)
Addended by: Jearld Adjutant on: 03/04/2022 04:35 PM   Modules accepted: Orders

## 2022-03-04 NOTE — Progress Notes (Signed)
   PRENATAL VISIT NOTE  Subjective:  Sylvia Walter is a 21 y.o. G1P0 at [redacted]w[redacted]d being seen today for ongoing prenatal care.  She is currently monitored for the following issues for this high-risk pregnancy and has Supervision of high-risk pregnancy; Dichorionic diamniotic twin pregnancy; Nausea and vomiting during pregnancy; Alpha thalassemia silent carrier; and Gestational diabetes mellitus (GDM) affecting pregnancy on their problem list.  Patient reports no complaints.  Contractions: Irritability. Vag. Bleeding: None.  Movement: Present. Denies leaking of fluid.   The following portions of the patient's history were reviewed and updated as appropriate: allergies, current medications, past family history, past medical history, past social history, past surgical history and problem list.   Objective:   Vitals:   03/04/22 1601  BP: 124/75  Pulse: (!) 102  Weight: 214 lb 3.2 oz (97.2 kg)    Fetal Status: Fetal Heart Rate (bpm): A:151 B:138   Movement: Present     General:  Alert, oriented and cooperative. Patient is in no acute distress.  Skin: Skin is warm and dry. No rash noted.   Cardiovascular: Normal heart rate noted  Respiratory: Normal respiratory effort, no problems with respiration noted  Abdomen: Soft, gravid, appropriate for gestational age.  Pain/Pressure: Present     Pelvic: Cervical exam performed in the presence of a chaperone Dilation: Closed Effacement (%): Thick Station: Ballotable  Extremities: Normal range of motion.  Edema: Trace  Mental Status: Normal mood and affect. Normal behavior. Normal judgment and thought content.   Assessment and Plan:  Pregnancy: G1P0 at [redacted]w[redacted]d 1. Supervision of high risk pregnancy in third trimester Patient is doing well without complaints GBS today  2. Dichorionic diamniotic twin pregnancy in third trimester Continue antenatal testing  3. Gestational diabetes mellitus (GDM) affecting pregnancy CBGs reviewed and all within  range Continue diet control  Preterm labor symptoms and general obstetric precautions including but not limited to vaginal bleeding, contractions, leaking of fluid and fetal movement were reviewed in detail with the patient. Please refer to After Visit Summary for other counseling recommendations.   No follow-ups on file.  Future Appointments  Date Time Provider Department Center  03/11/2022 12:45 PM [facility] NURSE [facility]  03/11/2022  1:00 PM [facility] US1 [facility]  03/18/2022  1:45 PM [facility] NURSE [facility]  03/18/2022  2:00 PM [facility] US1 [facility]    Catalina Antigua, MD

## 2022-03-04 NOTE — Progress Notes (Signed)
Pt presents for ROB visit. No concerns at this time.  

## 2022-03-05 LAB — CERVICOVAGINAL ANCILLARY ONLY
Bacterial Vaginitis (gardnerella): NEGATIVE
Candida Glabrata: NEGATIVE
Candida Vaginitis: NEGATIVE
Chlamydia: NEGATIVE
Comment: NEGATIVE
Comment: NEGATIVE
Comment: NEGATIVE
Comment: NEGATIVE
Comment: NEGATIVE
Comment: NORMAL
Neisseria Gonorrhea: NEGATIVE
Trichomonas: NEGATIVE

## 2022-03-06 ENCOUNTER — Inpatient Hospital Stay (HOSPITAL_COMMUNITY)
Admission: AD | Admit: 2022-03-06 | Discharge: 2022-03-06 | Disposition: A | Discharge status: Home / Self Care | Payer: BC Managed Care – PPO | Attending: Obstetrics and Gynecology | Admitting: Obstetrics and Gynecology

## 2022-03-06 ENCOUNTER — Other Ambulatory Visit: Payer: Self-pay

## 2022-03-06 ENCOUNTER — Encounter (HOSPITAL_COMMUNITY): Payer: Self-pay | Admitting: Obstetrics and Gynecology

## 2022-03-06 DIAGNOSIS — O30043 Twin pregnancy, dichorionic/diamniotic, third trimester: Secondary | ICD-10-CM | POA: Insufficient documentation

## 2022-03-06 DIAGNOSIS — O368130 Decreased fetal movements, third trimester, not applicable or unspecified: Secondary | ICD-10-CM | POA: Insufficient documentation

## 2022-03-06 DIAGNOSIS — O10913 Unspecified pre-existing hypertension complicating pregnancy, third trimester: Secondary | ICD-10-CM | POA: Diagnosis not present

## 2022-03-06 DIAGNOSIS — O368131 Decreased fetal movements, third trimester, fetus 1: Secondary | ICD-10-CM

## 2022-03-06 DIAGNOSIS — O368132 Decreased fetal movements, third trimester, fetus 2: Secondary | ICD-10-CM | POA: Diagnosis not present

## 2022-03-06 DIAGNOSIS — Z3A35 35 weeks gestation of pregnancy: Secondary | ICD-10-CM | POA: Diagnosis not present

## 2022-03-06 DIAGNOSIS — O133 Gestational [pregnancy-induced] hypertension without significant proteinuria, third trimester: Secondary | ICD-10-CM

## 2022-03-06 LAB — CBC
HCT: 28.5 % — ABNORMAL LOW (ref 36.0–46.0)
Hemoglobin: 8 g/dL — ABNORMAL LOW (ref 12.0–15.0)
MCH: 19.1 pg — ABNORMAL LOW (ref 26.0–34.0)
MCHC: 28.1 g/dL — ABNORMAL LOW (ref 30.0–36.0)
MCV: 68.2 fL — ABNORMAL LOW (ref 80.0–100.0)
Platelets: 200 10*3/uL (ref 150–400)
RBC: 4.18 MIL/uL (ref 3.87–5.11)
RDW: 23.3 % — ABNORMAL HIGH (ref 11.5–15.5)
WBC: 14.2 10*3/uL — ABNORMAL HIGH (ref 4.0–10.5)
nRBC: 2.9 % — ABNORMAL HIGH (ref 0.0–0.2)

## 2022-03-06 LAB — COMPREHENSIVE METABOLIC PANEL
ALT: 23 U/L (ref 0–44)
AST: 31 U/L (ref 15–41)
Albumin: 2.5 g/dL — ABNORMAL LOW (ref 3.5–5.0)
Alkaline Phosphatase: 112 U/L (ref 38–126)
Anion gap: 9 (ref 5–15)
BUN: 7 mg/dL (ref 6–20)
CO2: 20 mmol/L — ABNORMAL LOW (ref 22–32)
Calcium: 9.4 mg/dL (ref 8.9–10.3)
Chloride: 109 mmol/L (ref 98–111)
Creatinine, Ser: 0.86 mg/dL (ref 0.44–1.00)
GFR, Estimated: >60 mL/min (ref >60)
Glucose, Bld: 91 mg/dL (ref 70–99)
Potassium: 4.1 mmol/L (ref 3.5–5.1)
Sodium: 138 mmol/L (ref 135–145)
Total Bilirubin: 0.3 mg/dL (ref 0.3–1.2)
Total Protein: 5.3 g/dL — ABNORMAL LOW (ref 6.5–8.1)

## 2022-03-06 LAB — PROTEIN / CREATININE RATIO, URINE
Creatinine, Urine: 119.15 mg/dL
Protein Creatinine Ratio: 0.24 mg/mg{Cre} — ABNORMAL HIGH (ref 0.00–0.15)
Total Protein, Urine: 29 mg/dL

## 2022-03-06 LAB — URINALYSIS, ROUTINE W REFLEX MICROSCOPIC
Bilirubin Urine: NEGATIVE
Glucose, UA: NEGATIVE mg/dL
Hgb urine dipstick: NEGATIVE
Ketones, ur: NEGATIVE mg/dL
Leukocytes,Ua: NEGATIVE
Nitrite: NEGATIVE
Protein, ur: NEGATIVE mg/dL
Specific Gravity, Urine: 1.015 (ref 1.005–1.030)
pH: 7 (ref 5.0–8.0)

## 2022-03-06 MED ORDER — ACETAMINOPHEN 325 MG PO TABS
650.0000 mg | ORAL_TABLET | Freq: Once | ORAL | Status: AC
  Administered 2022-03-06: 650 mg via ORAL
  Filled 2022-03-06 (×2): qty 2

## 2022-03-06 MED ORDER — CYCLOBENZAPRINE HCL 5 MG PO TABS
5.0000 mg | ORAL_TABLET | Freq: Once | ORAL | Status: AC
  Administered 2022-03-06: 5 mg via ORAL
  Filled 2022-03-06: qty 1

## 2022-03-06 NOTE — Progress Notes (Signed)
OK to d/c EFM and prepare for d/c home

## 2022-03-06 NOTE — Progress Notes (Signed)
Written and verbal d/c instructions given and understanding voiced. 

## 2022-03-06 NOTE — MAU Provider Note (Signed)
Chief Complaint:  Decreased Fetal Movement   Event Date/Time   First Provider Initiated Contact with Patient 03/06/22 0334     HPI: Sylvia Walter is a 21 y.o. G1P0 at 68w5dho presents to maternity admissions reporting decreased fetal movement tonight.  States they moved a little in the waiting room.  . She reports good fetal movement, denies LOF, vaginal bleeding, vaginal itching/burning, urinary symptoms, h/a, dizziness, n/v, diarrhea, constipation or fever/chills.  She denies headache, visual changes or RUQ abdominal pain.  Hypertension This is a recurrent problem. The current episode started today. The problem is unchanged. Pertinent negatives include no anxiety, blurred vision, chest pain, headaches or peripheral edema. There are no associated agents to hypertension. Past treatments include nothing. There are no compliance problems.    RN Note: Sylvia BYRUMis a 21y.o. at 310w5dere in MAU reporting: pt has di/di twins. Felt "light" movement yesterday morning around 1100 and "nothing really" since. Pt states she did feel movement from one baby while sitting in the lobby. Denies pain, VB, LOF, HA, visual disturbances, or epigastric pain.   Onset of complaint: 1100 6/29 Pain score: 0  Past Medical History: Past Medical History:  Diagnosis Date   ADHD    Alpha thalassemia silent carrier 10/25/2021    Past obstetric history: OB History  Gravida Para Term Preterm AB Living  1         0  SAB IAB Ectopic Multiple Live Births          0    # Outcome Date GA Lbr Len/2nd Weight Sex Delivery Anes PTL Lv  1 Current             Past Surgical History: Past Surgical History:  Procedure Laterality Date   NO PAST SURGERIES      Family History: Family History  Problem Relation Age of Onset   Hypertension Mother     Social History: Social History   Tobacco Use   Smoking status: Never   Smokeless tobacco: Never  Vaping Use   Vaping Use: Never used  Substance Use Topics    Alcohol use: Not Currently    Comment: occ, prior to pregnancy   Drug use: Never    Allergies:  Allergies  Allergen Reactions   Other Other (See Comments)    Grass/ pecans    Meds:  Medications Prior to Admission  Medication Sig Dispense Refill Last Dose   ASPIRIN 81 PO Take by mouth.   03/05/2022   Prenat-FeFum-DSS-FA-DHA w/o A (PNV-DHA+DOCUSATE) 27-1.25-300 MG CAPS Take 1 capsule by mouth daily before breakfast. 90 capsule 4 03/05/2022   vitamin B-12 (CYANOCOBALAMIN) 500 MCG tablet Take 500 mcg by mouth daily.   03/05/2022   Accu-Chek Softclix Lancets lancets Use one lancet to check blood sugar 4 times daily 100 each 5    Blood Glucose Monitoring Suppl (ACCU-CHEK GUIDE) w/Device KIT Use meter to check blood sugar up to 4 times daily 1 kit 0    Blood Pressure Monitoring (BLOOD PRESSURE KIT) DEVI 1 kit by Does not apply route once a week. 1 each 0    Elastic Bandages & Supports (COMFORT FIT MATERNITY SUPP SM) MISC Wear as directed. (Patient not taking: Reported on 03/03/2022) 1 each 0    glucose blood (ACCU-CHEK GUIDE) test strip Use one test strip to check blood sugar 4 times daily 100 each 5    Iron Polysacch Cmplx-B12-FA 150-0.025-1 MG CAPS Take 1 capsule by mouth every other day. (Patient not  taking: Reported on 03/04/2022) 30 capsule 5    loratadine (CLARITIN) 10 MG tablet Take 1 tablet (10 mg total) by mouth daily. (Patient not taking: Reported on 03/04/2022) 30 tablet 11     I have reviewed patient's Past Medical Hx, Surgical Hx, Family Hx, Social Hx, medications and allergies.   ROS:  Review of Systems  Eyes:  Negative for blurred vision.  Cardiovascular:  Negative for chest pain.  Neurological:  Negative for headaches.   Other systems negative  Physical Exam  Patient Vitals for the past 24 hrs:  BP Temp Temp src Pulse Resp SpO2 Height Weight  03/06/22 0315 (!) 151/95 97.8 F (36.6 C) Oral 93 17 99 % _0  (1.702 m) 98.1 kg   Constitutional: Well-developed,  well-nourished female in no acute distress.  Cardiovascular: normal rate and rhythm Respiratory: normal effort, clear to auscultation bilaterally GI: Abd soft, non-tender, gravid appropriate for gestational age.   No rebound or guarding. MS: Extremities nontender, no edema, normal ROM Neurologic: Alert and oriented x 4.  GU: Neg CVAT.  PELVIC EXAM: Dilation: Closed Effacement (%): Thick Station: Ballotable Exam by:: Hansel Feinstein CNM  FHT:  Baseline 140 x2 , moderate variability, accelerations present, no decelerations  Both reactive throughout monitoring period Contractions:Irregular     Labs: Results for orders placed or performed during the hospital encounter of 03/06/22 (from the past 24 hour(s))  Urinalysis, Routine w reflex microscopic Urine, Clean Catch     Status: None   Collection Time: 03/06/22  4:00 AM  Result Value Ref Range   Color, Urine YELLOW YELLOW   APPearance CLEAR CLEAR   Specific Gravity, Urine 1.015 1.005 - 1.030   pH 7.0 5.0 - 8.0   Glucose, UA NEGATIVE NEGATIVE mg/dL   Hgb urine dipstick NEGATIVE NEGATIVE   Bilirubin Urine NEGATIVE NEGATIVE   Ketones, ur NEGATIVE NEGATIVE mg/dL   Protein, ur NEGATIVE NEGATIVE mg/dL   Nitrite NEGATIVE NEGATIVE   Leukocytes,Ua NEGATIVE NEGATIVE  Protein / creatinine ratio, urine     Status: Abnormal   Collection Time: 03/06/22  4:00 AM  Result Value Ref Range   Creatinine, Urine 119.15 mg/dL   Total Protein, Urine 29 mg/dL   Protein Creatinine Ratio 0.24 (H) 0.00 - 0.15 mg/mg[Cre]  CBC     Status: Abnormal   Collection Time: 03/06/22  4:02 AM  Result Value Ref Range   WBC 14.2 (H) 4.0 - 10.5 K/uL   RBC 4.18 3.87 - 5.11 MIL/uL   Hemoglobin 8.0 (L) 12.0 - 15.0 g/dL   HCT 28.5 (L) 36.0 - 46.0 %   MCV 68.2 (L) 80.0 - 100.0 fL   MCH 19.1 (L) 26.0 - 34.0 pg   MCHC 28.1 (L) 30.0 - 36.0 g/dL   RDW 23.3 (H) 11.5 - 15.5 %   Platelets 200 150 - 400 K/uL   nRBC 2.9 (H) 0.0 - 0.2 %  Comprehensive metabolic panel      Status: Abnormal   Collection Time: 03/06/22  4:02 AM  Result Value Ref Range   Sodium 138 135 - 145 mmol/L   Potassium 4.1 3.5 - 5.1 mmol/L   Chloride 109 98 - 111 mmol/L   CO2 20 (L) 22 - 32 mmol/L   Glucose, Bld 91 70 - 99 mg/dL   BUN 7 6 - 20 mg/dL   Creatinine, Ser 0.86 0.44 - 1.00 mg/dL   Calcium 9.4 8.9 - 10.3 mg/dL   Total Protein 5.3 (L) 6.5 - 8.1 g/dL   Albumin  2.5 (L) 3.5 - 5.0 g/dL   AST 31 15 - 41 U/L   ALT 23 0 - 44 U/L   Alkaline Phosphatase 112 38 - 126 U/L   Total Bilirubin 0.3 0.3 - 1.2 mg/dL   GFR, Estimated >60 >60 mL/min   Anion gap 9 5 - 15    B/Positive/-- (01/25 1414)  Imaging:   MAU Course/MDM: I have reviewed the triage vital signs and the nursing notes.   Pertinent labs & imaging results that were available during my care of the patient were reviewed by me and considered in my medical decision making (see chart for details).      I have reviewed her medical records including past results, notes and treatments.   I have ordered labs and reviewed results.  These were Normal BPs were elevated, also noted an elevated BP on 02/03/22   >>>  Gestational Hypertension NST reviewed Consult Dr Elly Modena with presentation, exam findings and test results. She recommends pt to be discharged with close followup in office Treatments in MAU included NST, labs.    Assessment: Di-Di Twin Gestation at 6w5dDecreased fetal movement, reassuring fetal monitoring with good movement after New Gestational Hypertension  Plan: Discharge home Preeclampsia precautions Labor precautions and fetal kick counts Follow up in Office for prenatal visits and recheck Encouraged to return if she develops worsening of symptoms, increase in pain, fever, or other concerning symptoms.  Pt stable at time of discharge.  MHansel FeinsteinCNM, MSN Certified Nurse-Midwife 03/06/2022 3:34 AM

## 2022-03-06 NOTE — MAU Note (Signed)
Sylvia Walter is a 21 y.o. at [redacted]w[redacted]d here in MAU reporting: pt has di/di twins. Felt "light" movement yesterday morning around 1100 and "nothing really" since. Pt states she did feel movement from one baby while sitting in the lobby. Denies pain, VB, LOF, HA, visual disturbances, or epigastric pain.   Onset of complaint: 1100 6/29 Pain score: 0  Vitals:   03/06/22 0315  BP: (!) 151/95  Pulse: 93  Resp: 17  Temp: 97.8 F (36.6 C)  SpO2: 99%    FHT: baby A: 141 baby B: 147 - external monitors applied in room.

## 2022-03-08 LAB — CULTURE, BETA STREP (GROUP B ONLY): Strep Gp B Culture: NEGATIVE

## 2022-03-11 ENCOUNTER — Ambulatory Visit: Payer: BC Managed Care – PPO | Attending: Obstetrics and Gynecology

## 2022-03-11 ENCOUNTER — Ambulatory Visit: Payer: BC Managed Care – PPO | Admitting: *Deleted

## 2022-03-11 VITALS — BP 138/76 | HR 93

## 2022-03-11 DIAGNOSIS — O285 Abnormal chromosomal and genetic finding on antenatal screening of mother: Secondary | ICD-10-CM

## 2022-03-11 DIAGNOSIS — O24419 Gestational diabetes mellitus in pregnancy, unspecified control: Secondary | ICD-10-CM | POA: Diagnosis present

## 2022-03-11 DIAGNOSIS — O30043 Twin pregnancy, dichorionic/diamniotic, third trimester: Secondary | ICD-10-CM

## 2022-03-11 DIAGNOSIS — O10913 Unspecified pre-existing hypertension complicating pregnancy, third trimester: Secondary | ICD-10-CM | POA: Diagnosis not present

## 2022-03-11 DIAGNOSIS — O0993 Supervision of high risk pregnancy, unspecified, third trimester: Secondary | ICD-10-CM | POA: Diagnosis present

## 2022-03-11 DIAGNOSIS — O133 Gestational [pregnancy-induced] hypertension without significant proteinuria, third trimester: Secondary | ICD-10-CM

## 2022-03-11 DIAGNOSIS — O24410 Gestational diabetes mellitus in pregnancy, diet controlled: Secondary | ICD-10-CM | POA: Insufficient documentation

## 2022-03-11 DIAGNOSIS — Z3A36 36 weeks gestation of pregnancy: Secondary | ICD-10-CM | POA: Insufficient documentation

## 2022-03-11 DIAGNOSIS — D563 Thalassemia minor: Secondary | ICD-10-CM

## 2022-03-13 ENCOUNTER — Telehealth: Payer: Self-pay | Admitting: *Deleted

## 2022-03-13 NOTE — Telephone Encounter (Signed)
Pt called to office stating it was recommended she be delivered around 37 weeks. Pt would like to know if/when she can be seen to get IOL scheduled.  Attempt to contact- no answe. LVM making pt aware she has an appt in office on Monday- advised to keep appt in order to have routine care and IOL scheduled at that time.

## 2022-03-16 ENCOUNTER — Ambulatory Visit (INDEPENDENT_AMBULATORY_CARE_PROVIDER_SITE_OTHER): Payer: BC Managed Care – PPO | Admitting: Obstetrics & Gynecology

## 2022-03-16 ENCOUNTER — Ambulatory Visit (INDEPENDENT_AMBULATORY_CARE_PROVIDER_SITE_OTHER): Payer: BC Managed Care – PPO | Admitting: Licensed Clinical Social Worker

## 2022-03-16 VITALS — BP 134/86 | HR 87 | Wt 218.0 lb

## 2022-03-16 DIAGNOSIS — D563 Thalassemia minor: Secondary | ICD-10-CM

## 2022-03-16 DIAGNOSIS — O30043 Twin pregnancy, dichorionic/diamniotic, third trimester: Secondary | ICD-10-CM

## 2022-03-16 DIAGNOSIS — F4322 Adjustment disorder with anxiety: Secondary | ICD-10-CM

## 2022-03-16 DIAGNOSIS — Z3A37 37 weeks gestation of pregnancy: Secondary | ICD-10-CM

## 2022-03-16 DIAGNOSIS — O0993 Supervision of high risk pregnancy, unspecified, third trimester: Secondary | ICD-10-CM

## 2022-03-16 DIAGNOSIS — O24419 Gestational diabetes mellitus in pregnancy, unspecified control: Secondary | ICD-10-CM

## 2022-03-16 NOTE — Progress Notes (Unsigned)
ROB 37.1 wks Di/Di twins GBS negative Last Hgb/Hct 8/28 on 03/06/22 (IV Iron 03/02/22) A vertex last [place], B breech  BP 134/86 today (138/76 last week)

## 2022-03-16 NOTE — BH Specialist Note (Signed)
Integrated Behavioral Health Follow Up In-Person Visit  MRN: [id number] Name: [facility]  Number of Integrated Behavioral Health Clinician visits: 2- Second Visit  Session Start time: 1036   Session End time: 1105  Total time in minutes: 29 In person at [facility]   Types of Service: General Behavioral Integrated Care (BHI)  Interpretor:No. Interpretor Name and Language: none  Subjective: Drue R Farrugia is a 21 y.o. female accompanied by Father of child Patient was referred by L. Leftwich Kirby for anxious mood. Patient reports the following symptoms/concerns:  anxious mood Duration of problem: approx 4 months ; Severity of problem: mild  Objective: Mood: Good and Affect: Appropriate Risk of harm to self or others: No plan to harm self or others  Life Context: Family and Social: Lives with mother  School/Work: Employed at [employer]  Self-Care: Reading  Life Changes: new pregnancy  Patient and/or Family's Strengths/Protective Factors: Concrete supports in place (healthy food, safe environments, etc.)  Goals Addressed: Patient will:  Reduce symptoms of: anxiety   Increase knowledge and/or ability of: coping skills   Demonstrate ability to: Increase healthy adjustment to current life circumstances  Progress towards Goals: Ongoing  Interventions: Interventions utilized:  Supportive Counseling and Link to [facility] Standardized Assessments completed: PHQ 9  Patient and/or Family Response: Ms. Hansell reports improvement in mood with additional support and coping skills. Ms. Goedken reports father of baby and mother are very supportive. Ms. Gielow induction is scheduled for 03/17/2022. LCSW A. Felton Clinton submitted breast pump request through [employer] and provided additional resources for patient.   Assessment: Patient currently experiencing adjustment disorder with anxious mood .   Patient may benefit from integrated behavioral health.  Plan: Follow up  with behavioral health clinician on : during postpartum visit Behavioral recommendations: delegate task to prevent burnout, communicate needs with father of babies and supportive family members, prioritize rest, continue with mindfulness and relaxation techniques Referral(s): Integrated [employer] (In Clinic) "From scale of 1-10, how likely are you to follow plan?":    Gwyndolyn Saxon, LCSW

## 2022-03-16 NOTE — Progress Notes (Unsigned)
   PRENATAL VISIT NOTE  Subjective:  Sylvia Walter is a 21 y.o. G1P0 at [redacted]w[redacted]d being seen today for ongoing prenatal care.  She is currently monitored for the following issues for this high-risk pregnancy and has Supervision of high-risk pregnancy; Dichorionic diamniotic twin pregnancy; Nausea and vomiting during pregnancy; Alpha thalassemia silent carrier; and Gestational diabetes mellitus (GDM) affecting pregnancy on their problem list.  Patient reports no complaints.  Contractions: Irritability. Vag. Bleeding: None.  Movement: Present. Denies leaking of fluid.   The following portions of the patient's history were reviewed and updated as appropriate: allergies, current medications, past family history, past medical history, past social history, past surgical history and problem list.   Objective:   Vitals:   03/16/22 1012  BP: 134/86  Pulse: 87  Weight: 218 lb (98.9 kg)    Fetal Status: Fetal Heart Rate (bpm): A:134 B:138   Movement: Present     General:  Alert, oriented and cooperative. Patient is in no acute distress.  Skin: Skin is warm and dry. No rash noted.   Cardiovascular: Normal heart rate noted  Respiratory: Normal respiratory effort, no problems with respiration noted  Abdomen: Soft, gravid, appropriate for gestational age.  Pain/Pressure: Present     Pelvic: Cervical exam deferred        Extremities: Normal range of motion.  Edema: Trace  Mental Status: Normal mood and affect. Normal behavior. Normal judgment and thought content.   Assessment and Plan:  Pregnancy: G1P0 at [redacted]w[redacted]d 1. Gestational diabetes mellitus (GDM) affecting pregnancy Diet control   2. Alpha thalassemia silent carrier   3. Dichorionic diamniotic twin pregnancy in third trimester Concordant growth, Vtx /Br, MFM recommended delivery after 37 week, patient requested schedule for tomorrow Candidate for vaginal delivery 4. Supervision of high risk pregnancy in third trimester   Term labor  symptoms and general obstetric precautions including but not limited to vaginal bleeding, contractions, leaking of fluid and fetal movement were reviewed in detail with the patient. Please refer to After Visit Summary for other counseling recommendations.   No follow-ups on file.  Future Appointments  Date Time Provider Department Center  03/18/2022  1:45 PM [facility] NURSE [facility]  03/18/2022  2:00 PM [facility] US1 [facility]  03/23/2022  3:30 PM Marny Lowenstein, PA-C [facility] None  03/30/2022  3:30 PM Constant, Gigi Gin, MD [facility] None    Scheryl Darter, MD

## 2022-03-17 ENCOUNTER — Inpatient Hospital Stay (HOSPITAL_COMMUNITY): Payer: BC Managed Care – PPO

## 2022-03-17 ENCOUNTER — Inpatient Hospital Stay (HOSPITAL_COMMUNITY): Payer: BC Managed Care – PPO | Admitting: Anesthesiology

## 2022-03-17 ENCOUNTER — Other Ambulatory Visit: Payer: Self-pay

## 2022-03-17 ENCOUNTER — Encounter (HOSPITAL_COMMUNITY): Payer: Self-pay | Admitting: Family Medicine

## 2022-03-17 ENCOUNTER — Inpatient Hospital Stay (HOSPITAL_COMMUNITY)
Admission: AD | Admit: 2022-03-17 | Discharge: 2022-03-20 | DRG: 807 | Disposition: A | Discharge status: Home / Self Care | Payer: BC Managed Care – PPO | Attending: Obstetrics & Gynecology | Admitting: Obstetrics & Gynecology

## 2022-03-17 DIAGNOSIS — O4593 Premature separation of placenta, unspecified, third trimester: Secondary | ICD-10-CM | POA: Diagnosis present

## 2022-03-17 DIAGNOSIS — O9902 Anemia complicating childbirth: Secondary | ICD-10-CM | POA: Diagnosis present

## 2022-03-17 DIAGNOSIS — Z3A37 37 weeks gestation of pregnancy: Secondary | ICD-10-CM | POA: Diagnosis not present

## 2022-03-17 DIAGNOSIS — O0993 Supervision of high risk pregnancy, unspecified, third trimester: Secondary | ICD-10-CM

## 2022-03-17 DIAGNOSIS — O0990 Supervision of high risk pregnancy, unspecified, unspecified trimester: Secondary | ICD-10-CM

## 2022-03-17 DIAGNOSIS — O24420 Gestational diabetes mellitus in childbirth, diet controlled: Secondary | ICD-10-CM | POA: Diagnosis present

## 2022-03-17 DIAGNOSIS — O321XX2 Maternal care for breech presentation, fetus 2: Secondary | ICD-10-CM | POA: Diagnosis present

## 2022-03-17 DIAGNOSIS — D563 Thalassemia minor: Secondary | ICD-10-CM | POA: Diagnosis present

## 2022-03-17 DIAGNOSIS — O30043 Twin pregnancy, dichorionic/diamniotic, third trimester: Principal | ICD-10-CM

## 2022-03-17 DIAGNOSIS — O99214 Obesity complicating childbirth: Secondary | ICD-10-CM | POA: Diagnosis present

## 2022-03-17 DIAGNOSIS — O134 Gestational [pregnancy-induced] hypertension without significant proteinuria, complicating childbirth: Secondary | ICD-10-CM | POA: Diagnosis present

## 2022-03-17 DIAGNOSIS — O24419 Gestational diabetes mellitus in pregnancy, unspecified control: Secondary | ICD-10-CM

## 2022-03-17 DIAGNOSIS — O326XX1 Maternal care for compound presentation, fetus 1: Secondary | ICD-10-CM | POA: Diagnosis not present

## 2022-03-17 DIAGNOSIS — D509 Iron deficiency anemia, unspecified: Secondary | ICD-10-CM | POA: Diagnosis present

## 2022-03-17 DIAGNOSIS — O30033 Twin pregnancy, monochorionic/diamniotic, third trimester: Secondary | ICD-10-CM | POA: Diagnosis not present

## 2022-03-17 LAB — COMPREHENSIVE METABOLIC PANEL
ALT: 18 U/L (ref 0–44)
AST: 32 U/L (ref 15–41)
Albumin: 2.7 g/dL — ABNORMAL LOW (ref 3.5–5.0)
Alkaline Phosphatase: 120 U/L (ref 38–126)
Anion gap: 10 (ref 5–15)
BUN: <5 mg/dL — ABNORMAL LOW (ref 6–20)
CO2: 19 mmol/L — ABNORMAL LOW (ref 22–32)
Calcium: 9.2 mg/dL (ref 8.9–10.3)
Chloride: 109 mmol/L (ref 98–111)
Creatinine, Ser: 0.68 mg/dL (ref 0.44–1.00)
GFR, Estimated: >60 mL/min (ref >60)
Glucose, Bld: 90 mg/dL (ref 70–99)
Potassium: 4 mmol/L (ref 3.5–5.1)
Sodium: 138 mmol/L (ref 135–145)
Total Bilirubin: 0.4 mg/dL (ref 0.3–1.2)
Total Protein: 5.7 g/dL — ABNORMAL LOW (ref 6.5–8.1)

## 2022-03-17 LAB — CBC
HCT: 31.5 % — ABNORMAL LOW (ref 36.0–46.0)
HCT: 31 % — ABNORMAL LOW (ref 36.0–46.0)
Hemoglobin: 8.8 g/dL — ABNORMAL LOW (ref 12.0–15.0)
Hemoglobin: 8.7 g/dL — ABNORMAL LOW (ref 12.0–15.0)
MCH: 19.6 pg — ABNORMAL LOW (ref 26.0–34.0)
MCH: 19.5 pg — ABNORMAL LOW (ref 26.0–34.0)
MCHC: 27.9 g/dL — ABNORMAL LOW (ref 30.0–36.0)
MCHC: 28.1 g/dL — ABNORMAL LOW (ref 30.0–36.0)
MCV: 70 fL — ABNORMAL LOW (ref 80.0–100.0)
MCV: 69.5 fL — ABNORMAL LOW (ref 80.0–100.0)
Platelets: 243 10*3/uL (ref 150–400)
Platelets: 258 10*3/uL (ref 150–400)
RBC: 4.5 MIL/uL (ref 3.87–5.11)
RBC: 4.46 MIL/uL (ref 3.87–5.11)
RDW: 29 % — ABNORMAL HIGH (ref 11.5–15.5)
RDW: 28.9 % — ABNORMAL HIGH (ref 11.5–15.5)
WBC: 12.3 10*3/uL — ABNORMAL HIGH (ref 4.0–10.5)
WBC: 13 10*3/uL — ABNORMAL HIGH (ref 4.0–10.5)
nRBC: 0 % (ref 0.0–0.2)
nRBC: 0 % (ref 0.0–0.2)

## 2022-03-17 LAB — PREPARE RBC (CROSSMATCH)

## 2022-03-17 LAB — PROTEIN / CREATININE RATIO, URINE
Creatinine, Urine: 32 mg/dL
Total Protein, Urine: <6 mg/dL

## 2022-03-17 LAB — ABO/RH: ABO/RH(D): B POS

## 2022-03-17 LAB — RPR: RPR Ser Ql: NONREACTIVE

## 2022-03-17 MED ORDER — FENTANYL CITRATE (PF) 100 MCG/2ML IJ SOLN
100.0000 ug | INTRAMUSCULAR | Status: DC | PRN
  Administered 2022-03-17: 100 ug via INTRAVENOUS
  Filled 2022-03-17: qty 2

## 2022-03-17 MED ORDER — OXYTOCIN-SODIUM CHLORIDE 30-0.9 UT/500ML-% IV SOLN
2.5000 [IU]/h | INTRAVENOUS | Status: DC
  Filled 2022-03-17: qty 500

## 2022-03-17 MED ORDER — LIDOCAINE HCL (PF) 1 % IJ SOLN
30.0000 mL | INTRAMUSCULAR | Status: DC | PRN
Start: 2022-03-17 — End: 2022-03-18

## 2022-03-17 MED ORDER — LACTATED RINGERS IV SOLN: INTRAVENOUS | Status: DC

## 2022-03-17 MED ORDER — TERBUTALINE SULFATE 1 MG/ML IJ SOLN: 0.2500 mg | Freq: Once | INTRAMUSCULAR | Status: DC | PRN

## 2022-03-17 MED ORDER — OXYCODONE-ACETAMINOPHEN 5-325 MG PO TABS: 2.0000 | ORAL_TABLET | ORAL | Status: DC | PRN

## 2022-03-17 MED ORDER — SOD CITRATE-CITRIC ACID 500-334 MG/5ML PO SOLN
30.0000 mL | ORAL | Status: DC | PRN
Start: 2022-03-17 — End: 2022-03-18

## 2022-03-17 MED ORDER — EPHEDRINE 5 MG/ML INJ: 10.0000 mg | INTRAVENOUS | Status: DC | PRN

## 2022-03-17 MED ORDER — PHENYLEPHRINE 80 MCG/ML (10ML) SYRINGE FOR IV PUSH (FOR BLOOD PRESSURE SUPPORT): 80.0000 ug | PREFILLED_SYRINGE | INTRAVENOUS | Status: DC | PRN

## 2022-03-17 MED ORDER — MISOPROSTOL 50MCG HALF TABLET
50.0000 ug | ORAL_TABLET | ORAL | Status: DC | PRN
Start: 2022-03-17 — End: 2022-03-18
  Administered 2022-03-17 (×2): 50 ug via BUCCAL
  Filled 2022-03-17 (×2): qty 1

## 2022-03-17 MED ORDER — ACETAMINOPHEN 325 MG PO TABS
650.0000 mg | ORAL_TABLET | ORAL | Status: DC | PRN
Start: 2022-03-17 — End: 2022-03-18

## 2022-03-17 MED ORDER — OXYCODONE-ACETAMINOPHEN 5-325 MG PO TABS: 1.0000 | ORAL_TABLET | ORAL | Status: DC | PRN

## 2022-03-17 MED ORDER — FENTANYL-BUPIVACAINE-NACL 0.5-0.125-0.9 MG/250ML-% EP SOLN
12.0000 mL/h | EPIDURAL | Status: DC | PRN
  Administered 2022-03-17: 12 mL/h via EPIDURAL
  Filled 2022-03-17: qty 250

## 2022-03-17 MED ORDER — ONDANSETRON HCL 4 MG/2ML IJ SOLN
4.0000 mg | Freq: Four times a day (QID) | INTRAMUSCULAR | Status: DC | PRN
  Administered 2022-03-17: 4 mg via INTRAVENOUS
  Filled 2022-03-17: qty 2

## 2022-03-17 MED ORDER — LACTATED RINGERS IV SOLN: 500.0000 mL | INTRAVENOUS | Status: DC | PRN

## 2022-03-17 MED ORDER — OXYTOCIN-SODIUM CHLORIDE 30-0.9 UT/500ML-% IV SOLN: 1.0000 m[IU]/min | INTRAVENOUS | Status: DC

## 2022-03-17 MED ORDER — SODIUM CHLORIDE 0.9% IV SOLUTION: Freq: Once | INTRAVENOUS | Status: DC

## 2022-03-17 MED ORDER — LACTATED RINGERS IV SOLN
500.0000 mL | Freq: Once | INTRAVENOUS | Status: AC
  Administered 2022-03-17: 500 mL via INTRAVENOUS

## 2022-03-17 MED ORDER — PHENYLEPHRINE 80 MCG/ML (10ML) SYRINGE FOR IV PUSH (FOR BLOOD PRESSURE SUPPORT)
80.0000 ug | PREFILLED_SYRINGE | INTRAVENOUS | Status: DC | PRN
Start: 2022-03-17 — End: 2022-03-18

## 2022-03-17 MED ORDER — LIDOCAINE HCL (PF) 1 % IJ SOLN
INTRAMUSCULAR | Status: DC | PRN
  Administered 2022-03-17: 8 mL via EPIDURAL

## 2022-03-17 MED ORDER — DIPHENHYDRAMINE HCL 50 MG/ML IJ SOLN: 12.5000 mg | INTRAMUSCULAR | Status: DC | PRN

## 2022-03-17 MED ORDER — OXYTOCIN BOLUS FROM INFUSION: 333.0000 mL | Freq: Once | INTRAVENOUS | Status: DC

## 2022-03-17 NOTE — Anesthesia Preprocedure Evaluation (Signed)
Anesthesia Evaluation  Patient identified by MRN, date of birth, ID band Patient awake    Reviewed: Allergy & Precautions, NPO status , Patient's Chart, lab work & pertinent test results  Airway Mallampati: II  TM Distance: >3 FB Neck ROM: Full    Dental no notable dental hx.    Pulmonary neg pulmonary ROS,    Pulmonary exam normal breath sounds clear to auscultation       Cardiovascular hypertension, Pt. on medications Normal cardiovascular exam Rhythm:Regular Rate:Normal     Neuro/Psych negative neurological ROS  negative psych ROS   GI/Hepatic negative GI ROS, Neg liver ROS,   Endo/Other  diabetes, Gestationalobesity  Renal/GU negative Renal ROS  negative genitourinary   Musculoskeletal negative musculoskeletal ROS (+)   Abdominal   Peds negative pediatric ROS (+)  Hematology  (+) Blood dyscrasia, anemia ,   Anesthesia Other Findings   Reproductive/Obstetrics (+) Pregnancy Twin pregnancy                             Anesthesia Physical Anesthesia Plan  ASA: 3  Anesthesia Plan: Epidural   Post-op Pain Management:    Induction: Intravenous  PONV Risk Score and Plan:   Airway Management Planned:   Additional Equipment: None  Intra-op Plan:   Post-operative Plan:   Informed Consent: I have reviewed the patients History and Physical, chart, labs and discussed the procedure including the risks, benefits and alternatives for the proposed anesthesia with the patient or authorized representative who has indicated his/her understanding and acceptance.     Dental advisory given  Plan Discussed with: Anesthesiologist  Anesthesia Plan Comments:         Anesthesia Quick Evaluation

## 2022-03-17 NOTE — H&P (Signed)
LABOR AND DELIVERY ADMISSION HISTORY AND PHYSICAL NOTE  Sylvia Walter is a 21 y.o. female G1P0 with IUP at 68w2dby 9 week [place]resenting for IOL due to gestational hypertension. This is a monozygotic di-di twin gestation.   She reports positive fetal movement. She denies leakage of fluid or vaginal bleeding.  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, needs repeat postpartum  - Gestational hypertension  Past Medical History: Past Medical History:  Diagnosis Date   ADHD    Alpha thalassemia silent carrier 10/25/2021    Past Surgical History: Past Surgical History:  Procedure Laterality Date   NO PAST SURGERIES     ROOT CANAL Left 07/31/2020    Obstetrical History: OB History     Gravida  1   Para      Term      Preterm      AB      Living  0      SAB      IAB      Ectopic      Multiple      Live Births  0           Social History: Social History   Socioeconomic History   Marital status: Single    Spouse name: Not on file   Number of children: Not on file   Years of education: Not on file   Highest education level: Not on file  Occupational History   Not on file  Tobacco Use   Smoking status: Never   Smokeless tobacco: Never  Vaping Use   Vaping Use: Never used  Substance and Sexual Activity   Alcohol use: Not Currently    Comment: occ, prior to pregnancy   Drug use: Never   Sexual activity: Not Currently    Partners: Male    Birth control/protection: None  Other Topics Concern   Not on file  Social History Narrative   Not on file   Social Determinants of Health   Financial Resource Strain: Not on file  Food Insecurity: Not on file  Transportation Needs: Not on file  Physical Activity: Not on file  Stress: Not on file  Social Connections: Not on file    Family History: Family History  Problem Relation Age of Onset    Hypertension Mother     Allergies: Allergies  Allergen Reactions   Other Other (See Comments)    Grass/ pecans    Medications Prior to Admission  Medication Sig Dispense Refill Last Dose   ASPIRIN 81 PO Take by mouth.   03/16/2022   Evening Primrose Oil 1000 MG CAPS Take 1,000 mg by mouth daily.   03/16/2022   Prenat-FeFum-DSS-FA-DHA w/o A (PNV-DHA+DOCUSATE) 27-1.25-300 MG CAPS Take 1 capsule by mouth daily before breakfast. 90 capsule 4 03/17/2022   vitamin B-12 (CYANOCOBALAMIN) 500 MCG tablet Take 500 mcg by mouth daily.   03/16/2022   Accu-Chek Softclix Lancets lancets Use one lancet to check blood sugar 4 times daily 100 each 5    Blood Glucose Monitoring Suppl (ACCU-CHEK GUIDE) w/Device KIT Use meter to check blood sugar up to 4 times daily 1 kit 0    Blood Pressure Monitoring (BLOOD PRESSURE KIT) DEVI 1 kit by Does not apply route once a week. 1 each 0    Elastic Bandages & Supports (COMFORT FIT MATERNITY SUPP SM) MISC Wear as directed. (Patient not taking:  Reported on 03/03/2022) 1 each 0    glucose blood (ACCU-CHEK GUIDE) test strip Use one test strip to check blood sugar 4 times daily 100 each 5    Iron Polysacch Cmplx-B12-FA 150-0.025-1 MG CAPS Take 1 capsule by mouth every other day. (Patient not taking: Reported on 03/04/2022) 30 capsule 5    loratadine (CLARITIN) 10 MG tablet Take 1 tablet (10 mg total) by mouth daily. (Patient not taking: Reported on 03/04/2022) 30 tablet 11      Review of Systems  All systems reviewed and negative except as stated in HPI  Physical Exam Blood pressure 131/82, pulse 86, temperature 98.3 F (36.8 C), temperature source Oral, resp. rate 16, height 5' 7"  (1.702 m), weight 99.2 kg. General appearance: alert, oriented, NAD Lungs: normal respiratory effort Heart: regular rate Abdomen: soft, non-tender; gravid, FH appropriate for GA Extremities: No calf swelling or tenderness Presentation: cephalic by BSUS, twin B breech  Fetal monitoring:    Twin A: 140/mod/15x15/none  Twin B: 135/mod/15x15/none Uterine activity: irregular  Dilation: 1.5 Effacement (%): 50 Exam by:: Dr. Higinio Plan  Prenatal labs: ABO, Rh: --/--/PENDING (07/11 7591) Antibody: PENDING (07/11 6384) Rubella: 8.69 (01/25 1414) RPR: Non Reactive (05/26 1205)  HBsAg: Negative (01/25 1414)  HIV: Non Reactive (05/26 1205)  GC/Chlamydia: negative  GBS: Negative/-- (06/28 1632)  2-hr GTT: passed Genetic screening: LR NIPS, silent carrier alpha thal  Anatomy US: normal x2  Prenatal Transfer Tool  Maternal Diabetes: No Genetic Screening: Normal (silent alpha thal)  Maternal Ultrasounds/Referrals: Other: Twins  Fetal Ultrasounds or other Referrals:  None Maternal Substance Abuse:  No Significant Maternal Medications:  None Significant Maternal Lab Results: Group B Strep negative  Results for orders placed or performed during the hospital encounter of 03/17/22 (from the past 24 hour(s))  CBC   Collection Time: 03/17/22  9:26 AM  Result Value Ref Range   WBC 12.3 (H) 4.0 - 10.5 K/uL   RBC 4.50 3.87 - 5.11 MIL/uL   Hemoglobin 8.8 (L) 12.0 - 15.0 g/dL   HCT 31.5 (L) 36.0 - 46.0 %   MCV 70.0 (L) 80.0 - 100.0 fL   MCH 19.6 (L) 26.0 - 34.0 pg   MCHC 27.9 (L) 30.0 - 36.0 g/dL   RDW 29.0 (H) 11.5 - 15.5 %   Platelets 243 150 - 400 K/uL   nRBC 0.0 0.0 - 0.2 %  Comprehensive metabolic panel   Collection Time: 03/17/22  9:26 AM  Result Value Ref Range   Sodium 138 135 - 145 mmol/L   Potassium 4.0 3.5 - 5.1 mmol/L   Chloride 109 98 - 111 mmol/L   CO2 19 (L) 22 - 32 mmol/L   Glucose, Bld 90 70 - 99 mg/dL   BUN <5 (L) 6 - 20 mg/dL   Creatinine, Ser 0.68 0.44 - 1.00 mg/dL   Calcium 9.2 8.9 - 10.3 mg/dL   Total Protein 5.7 (L) 6.5 - 8.1 g/dL   Albumin 2.7 (L) 3.5 - 5.0 g/dL   AST 32 15 - 41 U/L   ALT 18 0 - 44 U/L   Alkaline Phosphatase 120 38 - 126 U/L   Total Bilirubin 0.4 0.3 - 1.2 mg/dL   GFR, Estimated >60 >60 mL/min   Anion gap 10 5 - 15  Type and  screen [place]   Collection Time: 03/17/22  9:26 AM  Result Value Ref Range   ABO/RH(D) PENDING    Antibody Screen PENDING    Sample Expiration  03/20/2022,2359 Performed at [facility] [address] [place], [place] [address]   Prepare RBC (crossmatch)   Collection Time: 03/17/22  9:26 AM  Result Value Ref Range   Order Confirmation      ORDER PROCESSED BY BLOOD BANK Performed at [facility], [place] [address], [place], [place] [address]     Patient Active Problem List   Diagnosis Date Noted   Supervision of high risk pregnancy, antepartum 03/17/2022   Gestational diabetes mellitus (GDM) affecting pregnancy 03/04/2022   Alpha thalassemia silent carrier 10/25/2021   Dichorionic diamniotic twin pregnancy 10/01/2021   Nausea and vomiting during pregnancy 10/01/2021   Supervision of high-risk pregnancy 09/29/2021    Assessment: Sylvia Walter is a 21 y.o. G1P0 at 44w2dhere for IOL due to gestational hypertension, in setting of di-di twin gestation.   #Labor: BSUS with Twin A cephalic and Twin B breech (head to maternal right, back up). 1% discordance. Planning for vaginal delivery with breech extraction of the second infant. She is aware of risk of head entrapment, while low, of the second twin and/or need for STAT C/S if unable to retrieve.  #Pain: PRN, planning for epidural  #FWB: Cat I x2  #ID: GBS Negative  #MOF: Breast #MOC: Unsure, considering combined patch in the next few months, may bridge with POPs first.  #Circ: NA, both female.   #Gestational hypertension: BP mild range/WNL on admit. Labs WNL and P/Cr unremarkable. No symptoms. Cont to monitor.   #Iron Deficiency anemia: Hgb previously 8.0, but 8.8 on admit. Placed 2 U pRBCS on hold.   SPatriciaann Clan7/07/2022, 11:08 AM

## 2022-03-17 NOTE — Progress Notes (Signed)
Patient ID: Sylvia Walter, female   DOB: [birth year], 21 y.o.   MRN: [id number] Sylvia Walter is a 21 y.o. G1P0 at [redacted]w[redacted]d admitted for induction of labor due to diabetes mellitus A1DM, gestational hypertension , and multiple gestation   Subjective: comfortable with epidural  Objective: VS: 98.3, 89, 144/102  FHR A: baseline 155 bpm, Variability: moderate, Accelerations:present, Decelerations:  Present  occ mild variables FHR B: baseline 150 bpm, moderate variability, no accels, +recurrent deep variables Toco: q 1-4 mins   SVE:   Dilation: 6 Effacement (%): 70 Station: -2 Exam by:: Joellyn Haff CNM AROM baby A, mod amt clear fluid   Dr. Macon Large in  Labs: Lab Results  Component Value Date   WBC 13.0 (H) 03/17/2022   HGB 8.7 (L) 03/17/2022   HCT 31.0 (L) 03/17/2022   MCV 69.5 (L) 03/17/2022   PLT 258 03/17/2022    Assessment / Plan: IOL d/t DCDA twins, GHTN, A1DM, s/p cytotec x 1, foley bulb (out at 1545), contracting and changing cx on her own, Baby A now AROM'd- clear, Baby B recurrent variables s/p epidural, now improving.  Labor: active Fetal Wellbeing:  Category II Pain Control:  epidural Pre-eclampsia: asymptomatic, bp's stable, and labs stable I/D:  GBS neg Anticipated MOD: NSVB in OR  [name] CNM, [facility] 03/17/2022,  2012

## 2022-03-17 NOTE — Progress Notes (Signed)
Patient ID: LETISIA SCHWALB, female   DOB: June 26, 2001, 21 y.o.   MRN: [id number] KRISTEENA MEINEKE is a 21 y.o. G1P0 at [redacted]w[redacted]d admitted for induction of labor due to diabetes mellitus A1DM, gestational hypertension , and multiple gestation DCDA twins  Subjective: no complaints  Objective: BP (!) 150/88   Pulse 83   Temp 98.3 F (36.8 C) (Oral)   Resp 16   Ht 5\' 7"  (1.702 m)   Wt 99.2 kg   SpO2 100%   BMI 34.24 kg/m  No intake/output data recorded.  FHR A baseline 150 bpm, Variability: moderate, Accelerations:present, Decelerations:  Absent FHR B baseline 145, moderate variability, no accels, began to have recurrent deep variables again Toco: q 1-3 mins   SVE:   Dilation: 6 Effacement (%): 80 Station: -1 Exam by:: [email or web address], CNM  Labs: Lab Results  Component Value Date   WBC 13.0 (H) 03/17/2022   HGB 8.7 (L) 03/17/2022   HCT 31.0 (L) 03/17/2022   MCV 69.5 (L) 03/17/2022   PLT 258 03/17/2022    Assessment / Plan: IOL d/t DCDA twins, GHTN, A1DM, baby B having recurrent deep variables again, cx essentially unchanged, will reposition pt. Dr. 05/18/2022 aware and monitoring FHR  Labor: active Fetal Wellbeing:  Category II Pain Control:  epidural Pre-eclampsia: asymptomatic and bp's stable I/D:  GBS neg Anticipated MOD: TBD  Macon Large CNM, WHNP-BC 03/17/2022, 10:11 PM

## 2022-03-17 NOTE — Progress Notes (Signed)
Labor Progress Note Sylvia Walter is a 21 y.o. G1P0 at [redacted]w[redacted]d presented for IOL d/t gHTN.   S: Doing well. Feeling contractions.   O:  BP 130/84   Pulse 78   Temp 98.3 F (36.8 C) (Oral)   Resp 16   Ht 5\' 7"  (1.702 m)   Wt 99.2 kg   BMI 34.24 kg/m  EFM:  135/mod/15x15/none 130/mod/15x15/none  CVE: Dilation: 5 Effacement (%): 70 Station: -2 Presentation: Vertex Exam by:: Dr. [email or web address]   A&P: 21 y.o. G1P0 [redacted]w[redacted]d  #Labor: Progressing well s/p cytotec and FB. Still contracting frequently but does not appear uncomfortable. Offered AROM, but patient would like her epidural first. Will start pit 2x2 in the next 20-30 minutes, do not expect much will be needed. Plan for AROM after epidural.  #Pain: Planning epidural   #FWB: Cat I x2  #GBS negative  #gHTN: mild range. Labs WNL. Cont to monitor.   [redacted]w[redacted]d, DO 5:53 PM

## 2022-03-17 NOTE — Anesthesia Procedure Notes (Signed)
Epidural Patient location during procedure: OB Start time: 03/17/2022 7:20 PM End time: 03/17/2022 7:30 PM  Staffing Anesthesiologist: Mellody Dance, MD Performed: anesthesiologist   Preanesthetic Checklist Completed: patient identified, IV checked, site marked, risks and benefits discussed, monitors and equipment checked, pre-op evaluation and timeout performed  Epidural Patient position: sitting Prep: DuraPrep Patient monitoring: heart rate, cardiac monitor, continuous pulse ox and blood pressure Approach: midline Location: L2-L3 Injection technique: LOR saline  Needle:  Needle type: Tuohy  Needle gauge: 17 G Needle length: 9 cm Needle insertion depth: 5 cm Catheter type: closed end flexible Catheter size: 20 Guage Catheter at skin depth: 10 cm Test dose: negative and Other  Assessment Events: blood not aspirated, injection not painful, no injection resistance and negative IV test  Additional Notes Informed consent obtained prior to proceeding including risk of failure, 1% risk of PDPH, risk of minor discomfort and bruising.  Discussed rare but serious complications including epidural abscess, permanent nerve injury, epidural hematoma.  Discussed alternatives to epidural analgesia and patient desires to proceed.  Timeout performed pre-procedure verifying patient name, procedure, and platelet count.  Patient tolerated procedure well.

## 2022-03-17 NOTE — Progress Notes (Signed)
Labor Progress Note Sylvia Walter is a 21 y.o. G1P0 at [redacted]w[redacted]d presented for IOL d/t gHTN, with twin gestation.   S: Doing well. Some tolerable cramping with cytotec.   O:  BP (!) 144/81   Pulse 90   Temp 98.3 F (36.8 C) (Oral)   Resp 18   Ht 5\' 7"  (1.702 m)   Wt 99.2 kg   BMI 34.24 kg/m  EFM:  135/mod/15x15/none   135/mod/15x15/none   CVE: Dilation: 1.5 Effacement (%): 50 Station: -3 Presentation: Vertex Exam by:: Dr. [email or web address]   A&P: 21 y.o. G1P0 [redacted]w[redacted]d  #Labor: Cervix feels softer, but exam largely unchanged. After verbal consent, placed FB with 40 cc (head reasonably applied, will hold off on full 60 cc). Additional cytotec given.  #Pain: PRN  #FWB: Cat I  #GBS negative  #gHTN: BP mild range. No symptoms. Cont to monitor.    [redacted]w[redacted]d, DO 1:59 PM

## 2022-03-18 ENCOUNTER — Other Ambulatory Visit: Payer: Self-pay

## 2022-03-18 ENCOUNTER — Ambulatory Visit: Payer: BC Managed Care – PPO

## 2022-03-18 ENCOUNTER — Other Ambulatory Visit: Payer: BC Managed Care – PPO

## 2022-03-18 ENCOUNTER — Encounter (HOSPITAL_COMMUNITY): Payer: Self-pay | Admitting: Family Medicine

## 2022-03-18 ENCOUNTER — Encounter (HOSPITAL_COMMUNITY)
Admission: AD | Disposition: A | Discharge status: Home / Self Care | Payer: Self-pay | Source: Home / Self Care | Attending: Obstetrics & Gynecology

## 2022-03-18 DIAGNOSIS — O30033 Twin pregnancy, monochorionic/diamniotic, third trimester: Secondary | ICD-10-CM

## 2022-03-18 DIAGNOSIS — O326XX1 Maternal care for compound presentation, fetus 1: Secondary | ICD-10-CM

## 2022-03-18 DIAGNOSIS — O134 Gestational [pregnancy-induced] hypertension without significant proteinuria, complicating childbirth: Secondary | ICD-10-CM

## 2022-03-18 DIAGNOSIS — O24420 Gestational diabetes mellitus in childbirth, diet controlled: Secondary | ICD-10-CM

## 2022-03-18 DIAGNOSIS — O321XX2 Maternal care for breech presentation, fetus 2: Secondary | ICD-10-CM

## 2022-03-18 DIAGNOSIS — Z3A37 37 weeks gestation of pregnancy: Secondary | ICD-10-CM

## 2022-03-18 DIAGNOSIS — O4593 Premature separation of placenta, unspecified, third trimester: Secondary | ICD-10-CM

## 2022-03-18 LAB — CBC
HCT: 29.6 % — ABNORMAL LOW (ref 36.0–46.0)
Hemoglobin: 8.3 g/dL — ABNORMAL LOW (ref 12.0–15.0)
MCH: 19.8 pg — ABNORMAL LOW (ref 26.0–34.0)
MCHC: 28 g/dL — ABNORMAL LOW (ref 30.0–36.0)
MCV: 70.5 fL — ABNORMAL LOW (ref 80.0–100.0)
Platelets: 227 10*3/uL (ref 150–400)
RBC: 4.2 MIL/uL (ref 3.87–5.11)
RDW: 28.1 % — ABNORMAL HIGH (ref 11.5–15.5)
WBC: 16.6 10*3/uL — ABNORMAL HIGH (ref 4.0–10.5)
nRBC: 0 % (ref 0.0–0.2)

## 2022-03-18 LAB — GLUCOSE, CAPILLARY: Glucose-Capillary: 111 mg/dL — ABNORMAL HIGH (ref 70–99)

## 2022-03-18 SURGERY — Surgical Case
Anesthesia: Epidural

## 2022-03-18 MED ORDER — METHYLERGONOVINE MALEATE 0.2 MG/ML IJ SOLN
INTRAMUSCULAR | Status: DC | PRN
  Administered 2022-03-18: .2 mg via INTRAMUSCULAR

## 2022-03-18 MED ORDER — SIMETHICONE 80 MG PO CHEW: 80.0000 mg | CHEWABLE_TABLET | ORAL | Status: DC | PRN

## 2022-03-18 MED ORDER — FUROSEMIDE 20 MG PO TABS
20.0000 mg | ORAL_TABLET | Freq: Every day | ORAL | Status: DC
  Administered 2022-03-18 – 2022-03-20 (×3): 20 mg via ORAL
  Filled 2022-03-18 (×3): qty 1

## 2022-03-18 MED ORDER — MISOPROSTOL 200 MCG PO TABS
ORAL_TABLET | ORAL | Status: AC
  Filled 2022-03-18: qty 4

## 2022-03-18 MED ORDER — OXYTOCIN-SODIUM CHLORIDE 30-0.9 UT/500ML-% IV SOLN
INTRAVENOUS | Status: AC
  Filled 2022-03-18: qty 500

## 2022-03-18 MED ORDER — ONDANSETRON HCL 4 MG/2ML IJ SOLN: 4.0000 mg | INTRAMUSCULAR | Status: DC | PRN

## 2022-03-18 MED ORDER — MISOPROSTOL 200 MCG PO TABS
800.0000 ug | ORAL_TABLET | Freq: Once | ORAL | Status: AC
  Administered 2022-03-18: 800 ug via RECTAL

## 2022-03-18 MED ORDER — BENZOCAINE-MENTHOL 20-0.5 % EX AERO
1.0000 | INHALATION_SPRAY | CUTANEOUS | Status: DC | PRN
  Administered 2022-03-19: 1 via TOPICAL
  Filled 2022-03-18: qty 56

## 2022-03-18 MED ORDER — DIBUCAINE (PERIANAL) 1 % EX OINT: 1.0000 | TOPICAL_OINTMENT | CUTANEOUS | Status: DC | PRN

## 2022-03-18 MED ORDER — LACTATED RINGERS IV SOLN: INTRAVENOUS | Status: DC | PRN

## 2022-03-18 MED ORDER — OXYTOCIN-SODIUM CHLORIDE 30-0.9 UT/500ML-% IV SOLN
INTRAVENOUS | Status: DC | PRN
  Administered 2022-03-18: 30 [IU] via INTRAVENOUS

## 2022-03-18 MED ORDER — ONDANSETRON HCL 4 MG PO TABS: 4.0000 mg | ORAL_TABLET | ORAL | Status: DC | PRN

## 2022-03-18 MED ORDER — SODIUM CHLORIDE 0.9% FLUSH: 3.0000 mL | INTRAVENOUS | Status: DC | PRN

## 2022-03-18 MED ORDER — TRANEXAMIC ACID-NACL 1000-0.7 MG/100ML-% IV SOLN
INTRAVENOUS | Status: DC | PRN
  Administered 2022-03-18: 1000 mg via INTRAVENOUS

## 2022-03-18 MED ORDER — TETANUS-DIPHTH-ACELL PERTUSSIS 5-2.5-18.5 LF-MCG/0.5 IM SUSY: 0.5000 mL | PREFILLED_SYRINGE | Freq: Once | INTRAMUSCULAR | Status: DC

## 2022-03-18 MED ORDER — NIFEDIPINE ER OSMOTIC RELEASE 30 MG PO TB24
30.0000 mg | ORAL_TABLET | Freq: Every day | ORAL | Status: DC
  Administered 2022-03-18 – 2022-03-20 (×3): 30 mg via ORAL
  Filled 2022-03-18 (×3): qty 1

## 2022-03-18 MED ORDER — DIPHENHYDRAMINE HCL 25 MG PO CAPS: 25.0000 mg | ORAL_CAPSULE | Freq: Four times a day (QID) | ORAL | Status: DC | PRN

## 2022-03-18 MED ORDER — ZOLPIDEM TARTRATE 5 MG PO TABS: 5.0000 mg | ORAL_TABLET | Freq: Every evening | ORAL | Status: DC | PRN

## 2022-03-18 MED ORDER — COCONUT OIL OIL: 1.0000 | TOPICAL_OIL | Status: DC | PRN

## 2022-03-18 MED ORDER — SENNOSIDES-DOCUSATE SODIUM 8.6-50 MG PO TABS
2.0000 | ORAL_TABLET | Freq: Every day | ORAL | Status: DC
  Administered 2022-03-19 – 2022-03-20 (×2): 2 via ORAL
  Filled 2022-03-18 (×2): qty 2

## 2022-03-18 MED ORDER — MEASLES, MUMPS & RUBELLA VAC IJ SOLR: 0.5000 mL | Freq: Once | INTRAMUSCULAR | Status: DC

## 2022-03-18 MED ORDER — SODIUM CHLORIDE 0.9 % IV SOLN: 250.0000 mL | INTRAVENOUS | Status: DC | PRN

## 2022-03-18 MED ORDER — SODIUM CHLORIDE 0.9 % IV SOLN: INTRAVENOUS | Status: DC | PRN

## 2022-03-18 MED ORDER — WITCH HAZEL-GLYCERIN EX PADS: 1.0000 | MEDICATED_PAD | CUTANEOUS | Status: DC | PRN

## 2022-03-18 MED ORDER — SODIUM CHLORIDE 0.9% FLUSH
3.0000 mL | Freq: Two times a day (BID) | INTRAVENOUS | Status: DC
  Administered 2022-03-19: 3 mL via INTRAVENOUS

## 2022-03-18 MED ORDER — PRENATAL MULTIVITAMIN CH
1.0000 | ORAL_TABLET | Freq: Every day | ORAL | Status: DC
  Administered 2022-03-18 – 2022-03-20 (×3): 1 via ORAL
  Filled 2022-03-18 (×3): qty 1

## 2022-03-18 MED ORDER — IBUPROFEN 600 MG PO TABS
600.0000 mg | ORAL_TABLET | Freq: Four times a day (QID) | ORAL | Status: DC
  Administered 2022-03-18 – 2022-03-20 (×9): 600 mg via ORAL
  Filled 2022-03-18 (×10): qty 1

## 2022-03-18 MED ORDER — BISACODYL 10 MG RE SUPP: 10.0000 mg | Freq: Every day | RECTAL | Status: DC | PRN

## 2022-03-18 MED ORDER — ACETAMINOPHEN 325 MG PO TABS
650.0000 mg | ORAL_TABLET | ORAL | Status: DC | PRN
  Administered 2022-03-18 – 2022-03-20 (×6): 650 mg via ORAL
  Filled 2022-03-18 (×6): qty 2

## 2022-03-18 MED ORDER — FLEET ENEMA 7-19 GM/118ML RE ENEM: 1.0000 | ENEMA | Freq: Every day | RECTAL | Status: DC | PRN

## 2022-03-18 SURGICAL SUPPLY — 29 items
CHLORAPREP W/TINT 26ML (MISCELLANEOUS) ×4 IMPLANT
CLAMP CORD UMBIL (MISCELLANEOUS) ×2 IMPLANT
CLOTH BEACON ORANGE TIMEOUT ST (SAFETY) ×2 IMPLANT
DRSG OPSITE POSTOP 4X10 (GAUZE/BANDAGES/DRESSINGS) ×2 IMPLANT
ELECT REM PT RETURN 9FT ADLT (ELECTROSURGICAL) ×2
ELECTRODE REM PT RTRN 9FT ADLT (ELECTROSURGICAL) ×1 IMPLANT
EXTRACTOR VACUUM M CUP 4 TUBE (SUCTIONS) IMPLANT
GLOVE BIOGEL PI IND STRL 7.0 (GLOVE) ×3 IMPLANT
GLOVE BIOGEL PI INDICATOR 7.0 (GLOVE) ×3
GLOVE ECLIPSE 7.0 STRL STRAW (GLOVE) ×2 IMPLANT
GOWN STRL REUS W/TWL LRG LVL3 (GOWN DISPOSABLE) ×4 IMPLANT
KIT ABG SYR 3ML LUER SLIP (SYRINGE) IMPLANT
NDL HYPO 25X5/8 SAFETYGLIDE (NEEDLE) ×1 IMPLANT
NEEDLE HYPO 22GX1.5 SAFETY (NEEDLE) ×2 IMPLANT
NEEDLE HYPO 25X5/8 SAFETYGLIDE (NEEDLE) ×2 IMPLANT
NS IRRIG 1000ML POUR BTL (IV SOLUTION) ×2 IMPLANT
PACK C SECTION WH (CUSTOM PROCEDURE TRAY) ×2 IMPLANT
PAD ABD 7.5X8 STRL (GAUZE/BANDAGES/DRESSINGS) ×2 IMPLANT
PAD OB MATERNITY 4.3X12.25 (PERSONAL CARE ITEMS) ×2 IMPLANT
RTRCTR C-SECT PINK 25CM LRG (MISCELLANEOUS) IMPLANT
SUT PDS AB 0 CTX 36 PDP370T (SUTURE) IMPLANT
SUT PLAIN 2 0 XLH (SUTURE) IMPLANT
SUT VIC AB 0 CTX 36 (SUTURE) ×4
SUT VIC AB 0 CTX36XBRD ANBCTRL (SUTURE) ×2 IMPLANT
SUT VIC AB 4-0 KS 27 (SUTURE) ×2 IMPLANT
SYR CONTROL 10ML LL (SYRINGE) ×2 IMPLANT
TOWEL OR 17X24 6PK STRL BLUE (TOWEL DISPOSABLE) ×2 IMPLANT
TRAY FOLEY W/BAG SLVR 14FR LF (SET/KITS/TRAYS/PACK) ×2 IMPLANT
WATER STERILE IRR 1000ML POUR (IV SOLUTION) ×3 IMPLANT

## 2022-03-18 NOTE — Progress Notes (Signed)
    Faculty Practice OB/GYN Attending Note  Patient is currently in second stage, with Category I FHR tracing for Twin A and Category II for Twin B. Bedside ultrasound revealed vertex/breech presentation.  Patient counseled about delivering in the OR, and possible need for cesarean section.  The risks of surgery were discussed with the patient including but were not limited to: bleeding which may require transfusion or reoperation; infection which may require antibiotics; injury to bowel, bladder, ureters or other surrounding organs; injury to the fetus; need for additional procedures including hysterectomy in the event of a life-threatening hemorrhage; formation of adhesions; placental abnormalities with subsequent pregnancies; incisional problems; thromboembolic phenomenon and other postoperative/anesthesia complications.  The patient concurred with the proposed plan, giving informed written consent for the procedure if needed.   Anesthesia and OR aware.  To OR when ready.    [name], MD, FACOG Obstetrician & Gynecologist, [facility] for [employer], [facility]

## 2022-03-18 NOTE — Lactation Note (Signed)
This note was copied from a baby's chart. Lactation Consultation Note  Patient Name: SRUTHI MAURER [facility] RUEAV'W Date: 03/18/2022 Reason for consult: Initial assessment;Infant < 6lbs;Early term 37-38.6wks;Breastfeeding assistance;Other (Comment) Age:22 hours Baby A woke up on her own , LC changed a med - large wet diaper.  And assisted mom to obtain the depth with latch on the left breast.  Latch score 8 - with swallows and per mom comfortable.  After breast feeding - LC assisted mom to Pace feed the baby and she took 9 ml of donor milk.  LC provided separate I/O's sheets for Baby A and Baby B for mom and dad.  Mom expressed she is very tired and hungry. Mom plans to eat her breakfast and take a nap. Beckie Salts [facility] to set up the DEBP ) after mom naps.     Maternal Data Has patient been taught Hand Expression?: Yes Does the patient have breastfeeding experience prior to this delivery?: No  Feeding Mother's Current Feeding Choice: Breast Milk and Donor Milk  LATCH Score Latch: Grasps breast easily, tongue down, lips flanged, rhythmical sucking.  Audible Swallowing: A few with stimulation  Type of Nipple: Everted at rest and after stimulation (areola edema)  Comfort (Breast/Nipple): Soft / non-tender  Hold (Positioning): Assistance needed to correctly position infant at breast and maintain latch.  LATCH Score: 8   Lactation Tools Discussed/Used    Interventions Interventions: Breast feeding basics reviewed;Assisted with latch;Skin to skin;Breast massage;Hand express;Reverse pressure;Breast compression;Adjust position;Support pillows;Position options;Education;LC Services brochure;LPT handout/interventions  Discharge WIC Program: No  Consult Status Consult Status: Follow-up Date: 03/18/22 Follow-up type: In-patient    Matilde Sprang Lajoya Dombek 03/18/2022, 9:24 AM

## 2022-03-18 NOTE — Progress Notes (Signed)
RN alerted Donette Larry, CNM of pt's BPs remaining elevated despite having taken prescribed Procardia and Lasix this AM on nightshift. Per CNM, ok to still monitor BPS. No ned orders received.

## 2022-03-18 NOTE — Discharge Summary (Cosign Needed Addendum)
Postpartum Discharge Summary   Patient Name: Sylvia MCWATTERS DOB: 2001/08/25 MRN: [id number]  Date of admission: 03/17/2022 Delivery date:   Abree, Romick [[id number]]  03/18/2022    Kaliya, Shreiner [[id number]]  03/18/2022 Delivering provider:    Alison Stalling Devanshi [[id number]]  Ithzel, Fedorchak [[id number]]  Verita Schneiders A Date of discharge: 03/20/2022  Admitting diagnosis: Supervision of high risk pregnancy, antepartum [O09.90] Intrauterine pregnancy: [redacted]w[redacted]d    Secondary diagnosis:  Principal Problem:   Supervision of high risk pregnancy, antepartum  Additional problems: GHTN, DCDA twins, A1DM    Discharge diagnosis: Term Pregnancy Delivered, Gestational Hypertension, GDM A1, and abruption baby B                                               Post partum procedures:none Augmentation: AROM, Cytotec, and IP Foley Complications: Placental Abruption  Hospital course: Induction of Labor With Vaginal Delivery   21y.o. yo G1P1002 at 346w3das admitted to the hospital 03/17/2022 for induction of labor.  Indication for induction:  DCDA twins, GHTN, A1DM .  BP remained elevated, so started on procardia and 5 day course of lasix. Patient had labor course as follows: Membrane Rupture Time/Date:    McEllena, Kamen0[[id number]]8:02 PM    McPatricie, Geeslin0[[id number]]12:57 AM ,   McSinda, Leedom0[[id number]]03/17/2022    McAvacyn, Kloosterman0[[id number]]03/18/2022   Delivery Method:   McAlison Stallingahogany [[id number]]Vaginal, Spontaneous    McTavie, Haseman0[[id number]]Vaginal, Spontaneous Episiotomy:    McKeviana, Guida0[[id number]]None    McAlixis, Harmon0[[id number]]None Lacerations:     McYevette, Knust0[[id number]]1st degree    McKiele, Heavrin0[[id number]]1st degree Details of delivery can be found in separate delivery note.  Patient had a routine postpartum  course. Patient is discharged home 03/20/22.  Newborn Data: Birth date:   McJaslene, Marsteller0[[id number]]03/18/2022    McNicolet, Griffy0[[id number]]03/18/2022 Birth time:   McLyndzie, Zentz0[[id number]]12:42 AM    McJulieanne Manson0[[id number]]12:58 AM Gender:   McFelcia, Huebert0[[id number]]Female    McMallissa, Lorenzen0[[id number]]Female Living status:   McEmera, Bussie0[[id number]]  QJFHLK    TGYBW, LSLHT DSKAJGOT0[[id number]]Living Apgars:   McCresencia, Asmus0[[id number]]8 [address] [[id number]]1 ,   McTrevia, Nop0[[id number]]9 [place]0[[id number]]8 Weight:   McRavneet, Spilkerahogany [[id number]]5 lb 13.5 oz (2.65 kg)    Coole, GirlB Little [0[[id number]]4 lb 15.4 oz (2.25 kg)   Magnesium Sulfate received: No BMZ received: No Rhophylac:N/A MMR:N/A T-DaP:Given prenatally Flu: No Transfusion:No  Physical exam  Vitals:   03/19/22 1022 03/19/22 1416 03/19/22 2245 03/19/22 2350  BP: (!) 141/90 120/71 (!) 149/86 127/70  Pulse: 88 79 94 88  Resp:  _0 Temp:  98.3 F (36.8 C) 98.4 F (36.9 C)   TempSrc:  Oral Oral   SpO2:      Weight:      Height:       General: alert  Lochia: appropriate Uterine Fundus: firm DVT Evaluation: No evidence of DVT seen on physical exam. Negative Homan's sign. No cords or calf tenderness. Labs: Lab Results  Component Value Date   WBC 16.6 (H) 03/18/2022   HGB 8.3 (L) 03/18/2022   HCT 29.6 (L) 03/18/2022   MCV 70.5 (L) 03/18/2022   PLT 227 03/18/2022      Latest Ref Rng & Units 03/17/2022    9:26 AM  CMP  Glucose 70 - 99 mg/dL 90   BUN 6 - 20 mg/dL <5   Creatinine 0.44 - 1.00 mg/dL 0.68   Sodium 135 - 145 mmol/L 138   Potassium 3.5 - 5.1 mmol/L 4.0   Chloride 98 - 111 mmol/L 109   CO2 22 - 32 mmol/L 19   Calcium 8.9 - 10.3 mg/dL 9.2   Total Protein 6.5 - 8.1 g/dL 5.7   Total Bilirubin 0.3 - 1.2 mg/dL 0.4    Alkaline Phos 38 - 126 U/L 120   AST 15 - 41 U/L 32   ALT 0 - 44 U/L 18    Edinburgh Score:    03/19/2022    6:42 AM  Flavia Shipper Postnatal Depression Scale Screening Tool  I have been able to laugh and see the funny side of things. 0  I have looked forward with enjoyment to things. 0  I have blamed myself unnecessarily when things went wrong. 1  I have been anxious or worried for no good reason. 2  I have felt scared or panicky for no good reason. 1  Things have been getting on top of me. 0  I have been so unhappy that I have had difficulty sleeping. 0  I have felt sad or miserable. 1  I have been so unhappy that I have been crying. 1  The thought of harming myself has occurred to me. 0  Edinburgh Postnatal Depression Scale Total 6     After visit meds:  Allergies as of 03/20/2022       Reactions   Other Other (See Comments)   Grass/ pecans        Medication List     STOP taking these medications    Accu-Chek Guide test strip Generic drug: glucose blood   Accu-Chek Guide w/Device Kit   Accu-Chek Softclix Lancets lancets   ASPIRIN 81 PO   Blood Pressure Kit Devi   Comfort Fit Maternity Supp Sm Misc   Evening Primrose Oil 1000 MG Caps   Iron Polysacch Cmplx-B12-FA 150-0.025-1 MG Caps   loratadine 10 MG tablet Commonly known as: Claritin       TAKE these medications    cyanocobalamin 500 MCG tablet Commonly known as: CYANOCOBALAMIN Take 500 mcg by mouth daily.   FeroSul 325 (65 FE) MG tablet Generic drug: ferrous sulfate Take 1 tablet (325 mg total) by mouth every other day.   furosemide 20 MG tablet Commonly known as: LASIX Take 1 tablet (20 mg total) by mouth daily.   ibuprofen 600 MG tablet Commonly known as: ADVIL Take 1 tablet (600 mg total) by mouth every 6 (six) hours.   NIFEdipine 30 MG 24 hr tablet Commonly known as: ADALAT CC Take 1 tablet (30 mg total) by mouth daily.   norethindrone 0.35 MG tablet Commonly known as: Ortho  Micronor Take 1 tablet (0.35 mg total) by mouth daily.   PNV-DHA+Docusate 27-1.25-300 MG Caps Take 1 capsule by mouth daily before breakfast.         Discharge home in stable condition  Infant Feeding: Breast Infant Disposition:home with mother Discharge instruction: per After Visit Summary and Postpartum booklet. Activity: Advance as tolerated. Pelvic rest for 6 weeks.  Diet: routine diet Future Appointments: Future Appointments  Date Time Provider [place]  03/25/2022 10:40 AM [place] None  05/06/2022 10:55 AM Johnston Ebbs, NP [place] None   Follow up Visit: Roma Schanz, CNM  P [facility] Admin Pool-[place] Please schedule this patient for PP visit in: 1wk bp check, then 4-6wks for pp visit  High risk pregnancy complicated by: DCDA twins, GHTN, A1DM  Delivery mode:  SVD x2  Anticipated Birth Control:  other/unsure  PP Procedures needed: bp check, pap  Schedule Integrated BH visit: no  Provider: Any provider   Darci Current, PGY1  GME ATTESTATION:  I saw and evaluated the patient. I agree with the findings and the plan of care as documented in the resident's note and have made all necessary edits  Renard Matter, MD, MPH OB Fellow, Faculty Practice

## 2022-03-18 NOTE — Lactation Note (Signed)
This note was copied from a baby's chart. Lactation Consultation Note  Patient Name: [name] [facility] BPZWC'H Date: 03/18/2022  Age:22 hours P2, multiples, ETI infants  Per RN Lafonda Mosses) in L&D, both infant's have latched in L&D and mom would like to be seen later today on MBU. Maternal Data    Feeding    LATCH Score  Lactation Tools Discussed/Used    Interventions    Discharge    Consult Status      [name] 03/18/2022, 2:42 AM

## 2022-03-18 NOTE — Transfer of Care (Signed)
Immediate Anesthesia Transfer of Care Note  Patient: Sylvia Walter  Procedure(s) Performed: CESAREAN SECTION  Patient Location: PACU and Nursing Unit  Anesthesia Type:Epidural  Level of Consciousness: awake, alert  and oriented  Airway & Oxygen Therapy: Patient Spontanous Breathing  Post-op Assessment: Report given to RN and Post -op Vital signs reviewed and stable  Post vital signs: Reviewed and stable - Diana RN to accompany patient back from OR, post vaginal delivery, back to her labor room.   Last Vitals:  Vitals Value Taken Time  BP    Temp    Pulse    Resp    SpO2      Last Pain:  Vitals:   03/17/22 2145  TempSrc:   PainSc: 0-No pain         Complications: No notable events documented.

## 2022-03-18 NOTE — Lactation Note (Signed)
This note was copied from a baby's chart. Lactation Consultation Note  Patient Name: Sylvia Walter [facility] OEUMP'N Date: 03/18/2022 Reason for consult: Follow-up assessment;Early term 37-38.6wks;Multiple gestation;Breastfeeding assistance Age:22 hours  P1, Early Term, Multiple Gestation, <6lbs  LC entered the room and both babies were asleep in the bassinet. There were multiple visitors present despite visiting hours being over. LC attempted to assist mom with setting up the DEBP. Mom states that she would like to wait because she is using DBM and both babies are feeding well.   LC informed mom that it is a requirement to have her pump if she is using DBM. LC also let mom know that because the babies are small, she needs to pump. Mom states that she will pump, but she does not want to do it right now. Mom says that she will possibly wait until tomorrow.   LC spoke with the RN and informed her of what mom said. RN will speak with mom about pumping and visitors.   Interventions Interventions: Education  Discharge    Consult Status Consult Status: Follow-up Date: 03/19/22 Follow-up type: In-patient    Delene Loll 03/18/2022, 8:55 PM

## 2022-03-18 NOTE — Lactation Note (Signed)
This note was copied from a baby's chart. Lactation Consultation Note  Patient Name: Sylvia Walter [facility] ITVIF'X Date: 03/18/2022 Reason for consult: Initial assessment;Primapara;1st time breastfeeding;Early term 37-38.6wks;Infant < 6lbs Age:22 hours As LC entered the room, baby B was latched on the right breast with depth and swallows after LC showed mom breast compressions. Baby fed 20 mins , nipple well rounded when baby released. Mom expressed excitement she was able to latch the baby by herself.  LC reviewed the potential feeding behaviors of a baby less than 6 pounds and early term. Need for supplementing after feeding 15 -20 mins .  LC discussed donor milk and formula options. Mom desires to use the donor milk.  Consents signed and LC showed dad and mom how to pace feed the baby.  Baby B did well with the standard white nipple. Baby noted to be satisfied after feeding.   Maternal Data Has patient been taught Hand Expression?: Yes (per mom her doula showed her - LC reviewed) Does the patient have breastfeeding experience prior to this delivery?: No  Feeding Mother's Current Feeding Choice: Breast Milk and Donor Milk (discussed with mom the need to supplement - mom desires the donor milk)  LATCH Score Latch:  (latched with depth)  Audible Swallowing:  (few swallows with breast compressions)     Comfort (Breast/Nipple):  (per  mom comfortable)         Lactation Tools Discussed/Used Tools:  (mom aware the need for post pumping after feedings)  Interventions Interventions: Breast feeding basics reviewed;Reverse pressure;Education;LC Services brochure;LPT handout/interventions  Discharge WIC Program: No  Consult Status Consult Status: Follow-up Date: 03/18/22 Follow-up type: In-patient    Matilde Sprang Avigdor Dollar 03/18/2022, 8:51 AM

## 2022-03-18 NOTE — Anesthesia Postprocedure Evaluation (Signed)
Anesthesia Post Note  Patient: Sylvia Walter  Procedure(s) Performed: CESAREAN SECTION     Patient location during evaluation: Mother Baby Anesthesia Type: Epidural Level of consciousness: awake and alert Pain management: pain level controlled Vital Signs Assessment: post-procedure vital signs reviewed and stable Respiratory status: spontaneous breathing, nonlabored ventilation and respiratory function stable Cardiovascular status: stable Postop Assessment: no headache, no backache and epidural receding Anesthetic complications: no   No notable events documented.  Last Vitals:  Vitals:   03/18/22 0300 03/18/22 0355  BP: (!) 150/75 (!) 152/85  Pulse: 97 90  Resp:  18  Temp: (!) 38.3 C 37.1 C  SpO2:  99%    Last Pain:  Vitals:   03/18/22 0355  TempSrc: Oral  PainSc:    Pain Goal:                   Mellody Dance

## 2022-03-19 ENCOUNTER — Encounter (HOSPITAL_COMMUNITY): Payer: Self-pay | Admitting: Obstetrics & Gynecology

## 2022-03-19 NOTE — Progress Notes (Addendum)
Patient ID: Sylvia Walter, female   DOB: 07-21-2001, 21 y.o.   MRN: [id number] Post Partum Day 1 Subjective: no complaints, up ad lib, voiding, tolerating PO, and + flatus  Objective: Blood pressure 124/77, pulse 83, temperature 98.5 F (36.9 C), temperature source Oral, resp. rate 16, height 5\' 7"  (1.702 m), weight 99.2 kg, SpO2 100 %, unknown if currently breastfeeding.  Physical Exam:  General: alert and cooperative Lochia: appropriate Uterine Fundus: firm DVT Evaluation: No evidence of DVT seen on physical exam. Calf/Ankle edema is present. No redness or pain.  Recent Labs    03/17/22 1809 03/18/22 0541  HGB 8.7* 8.3*  HCT 31.0* 29.6*    Assessment/Plan: Plan for discharge tomorrow, Breastfeeding, Lactation consult, and Contraception with progesterone-only pills.   LOS: 2 days   05/19/22, Medical Student 03/19/2022, 5:47 AM     Attestation:  I confirm that I have verified the information documented in the  Medical Student 's note and that I have also personally reperformed the physical exam and all medical decision making activities.   Patient was seen and examined by me also Agree with note Vitals stable Labs stable Fundus firm, lochia within normal limits Perineum healing, would like to continue ice packs one more day Ext WNL Continue care Anticipate discharge tomorrow  03/21/2022, CNM

## 2022-03-19 NOTE — Lactation Note (Signed)
This note was copied from a baby's chart. Lactation Consultation Note FOB supplemented baby some then abby burped and fell asleep. Baby only took about 3 ml. Explained that isn't enough, try to get baby to eat more before hr of bottle DBM expiring. LC got baby to take the 20 ml DBM. Asked FOB to hold baby up right since took DBM. Didn't want baby to spit it up.  Discussed w/mom to talk w/ Pediatrician about what feeding plan do they want for the babies especially the 4.15 LB baby. Mentioned to mom some Dr.'s just want the babies less than 5 lbs to BF for 10 minutes then supplement and others wants to alternat BF and bottle feeding to conserve energy.  Discussed importance of mom pumping. Mom has a pump at home. Discussed pumping at work and home. Discussed need to supplement until babies reach full term or a certain weight that the Dr.'s want. The Dr.'s will decide how long to supplement but for now she has to be supplemented. Mom is fine will supplementing. Mom hopes to supplement w/just her BM when she gets home. Encouraged again to pump. Mom was thinking she would maybe gone home today. LC informed mom that smaller babies usually stay longer than full term babies. Mom stated understanding and relieved.  LC put t-shirt on baby, swaddled, and hat on head. Placed another blanket on baby. She has been running on the low side of normal. Just reminded BF STS just place cover on outside of baby.  Discussed alternating breast w/babies. Mom stated she thought that but feels that the Lt. Nipple might be to big for baby A. LC doesn't think it looks that big. Encouraged mom to try it next feeding.  Gave mom new 24 hr nipples for next 2 days. 2 nipples each. Discussed sterilizing nipples when she gets home. Reviewed washing nipples and bottle here. Answered mom's questions. Encouraged mom to write down questions she might have for Lactation or Dr.'s today. Encouraged to call for assistance or further  questions.  Feeding plan: BF babies according to MD's recommendations Supplement after BF Pump breast q3hrs    Patient Name: Sylvia Walter [facility] [id number] Date: 03/19/2022 Reason for consult: Mother's request;Primapara;Multiple gestation;Infant < 6lbs;Early term 37-38.6wks Age:26 hours  Maternal Data    Feeding Mother's Current Feeding Choice: Breast Milk and Donor Milk Nipple Type: Nfant Standard Flow (white)  LATCH Score                    Lactation Tools Discussed/Used Tools: Bottle  Interventions    Discharge    Consult Status Consult Status: Follow-up Date: 03/19/22 Follow-up type: In-patient    Charyl Dancer 03/19/2022, 3:11 AM

## 2022-03-19 NOTE — Lactation Note (Signed)
This note was copied from a baby's chart. Lactation Consultation Note Baby A BF great. Discussed supplementation increased to 20 ml after BF. Baby had PKU after BF and fell asleep afterwards so she didn't get supplemented. Encouraged mom OK this time but try to supplement after BF until weight is where the Dr.'s want it. Mom hasn't used DEBP. Mom stated she has been to tired and then feeding the babies. Encouraged mom to BF then let FOB supplement babies and after last twin BF then mom needs to post pump. Encouraged hydration, rest, low Na diet. Mom's legs are swollen. Reviewed LPI supplementation sheet. Reviewed not to let baby BF longer than 20 minutes then supplement. Total feeding not longer than 30 minutes. Mom states understanding. Mom is to increase supplement according to hours of age. Mom states understanding. Encouraged to keep something on their heads while air conditioning on keep temperature OK. Mom was BF STS, which LC praised mom for doing but babies need a light blanket laid over them while just in a diaper. Room was cool from air conditioning. Mom states understanding.   Patient Name: Sylvia Walter [facility] HUTML'Y Date: 03/19/2022 Reason for consult: Mother's request;Primapara;Early term 37-38.6wks;Infant < 6lbs;Multiple gestation Age:29 hours  Maternal Data    Feeding Mother's Current Feeding Choice: Breast Milk and Donor Milk  LATCH Score Latch: Grasps breast easily, tongue down, lips flanged, rhythmical sucking.  Audible Swallowing: A few with stimulation  Type of Nipple: Everted at rest and after stimulation  Comfort (Breast/Nipple): Soft / non-tender  Hold (Positioning): No assistance needed to correctly position infant at breast.  LATCH Score: 9   Lactation Tools Discussed/Used    Interventions Interventions: Breast feeding basics reviewed;Skin to skin;Breast compression;Support pillows;Position options  Discharge    Consult Status Consult  Status: Follow-up Date: 03/19/22 Follow-up type: In-patient    Chasey Dull, Diamond Nickel 03/19/2022, 3:02 AM

## 2022-03-19 NOTE — Lactation Note (Signed)
This note was copied from a baby's chart. Lactation Consultation Note  Patient Name: [facility] LJQGB'E Date: 03/19/2022 Reason for consult: Follow-up assessment;Early term 37-38.6wks;Multiple gestation;Infant < 6lbs Age:22 hours  Twins [redacted]w[redacted]d.  Baby A recently breastfed and Baby B being bottle fed by [employer]. Trying to increase volume.  Mother in bathroom.  Discussed pumping with maternal grandmother. Recommend mother call for assistance with latching and pumping.    Feeding Mother's Current Feeding Choice: Breast Milk and Donor Milk Nipple Type: Nfant Standard Flow (white)  Interventions Interventions: Education;DEBP  Discharge Pump: DEBP  Consult Status Consult Status: Follow-up Date: 03/20/22 Follow-up type: In-patient    Dahlia Byes [facility] 03/19/2022, 1:26 PM

## 2022-03-19 NOTE — Social Work (Signed)
[name] was referred for history of anxiety. * Referral screened out by Clinical Social Worker because none of the following criteria appear to apply:   ~ History of anxiety/depression during this pregnancy, or of post-partum depression following prior delivery.   ~ Diagnosis of anxiety and/or depression within last 3 years OR * [name]'s symptoms currently being treated with medication and/or therapy.   [name] is followed by Andrea at [facility], [name] has a follow up appointment at her 6 week postpartum visit.    Please contact the Clinical Social Worker if needs arise of by [name] request.   Sylvia Walter Clinical Social Work [phone] 

## 2022-03-20 ENCOUNTER — Other Ambulatory Visit (HOSPITAL_COMMUNITY): Payer: Self-pay

## 2022-03-20 MED ORDER — NIFEDIPINE ER 30 MG PO TB24
30.0000 mg | ORAL_TABLET | Freq: Every day | ORAL | 0 refills | Status: DC
  Filled 2022-03-20: qty 90, 90d supply, fill #0

## 2022-03-20 MED ORDER — FERROUS SULFATE 325 (65 FE) MG PO TABS
325.0000 mg | ORAL_TABLET | ORAL | 3 refills | Status: DC
  Filled 2022-03-20 – 2022-07-16 (×2): qty 30, 60d supply, fill #0

## 2022-03-20 MED ORDER — NORETHINDRONE 0.35 MG PO TABS
1.0000 | ORAL_TABLET | Freq: Every day | ORAL | 11 refills | Status: DC
  Filled 2022-03-20: qty 28, 28d supply, fill #0

## 2022-03-20 MED ORDER — SODIUM CHLORIDE 0.9 % IV SOLN
500.0000 mg | Freq: Once | INTRAVENOUS | Status: DC
  Filled 2022-03-20: qty 25

## 2022-03-20 MED ORDER — IBUPROFEN 600 MG PO TABS
600.0000 mg | ORAL_TABLET | Freq: Four times a day (QID) | ORAL | 0 refills | Status: DC
  Filled 2022-03-20: qty 30, 8d supply, fill #0

## 2022-03-20 MED ORDER — FUROSEMIDE 20 MG PO TABS
20.0000 mg | ORAL_TABLET | Freq: Every day | ORAL | 0 refills | Status: DC
  Filled 2022-03-20: qty 5, 5d supply, fill #0

## 2022-03-20 MED ORDER — NORETHINDRONE 0.35 MG PO TABS: 1.0000 | ORAL_TABLET | Freq: Every day | ORAL | 11 refills | Status: DC

## 2022-03-20 NOTE — Lactation Note (Addendum)
This note was copied from a baby's chart. Lactation Consultation Note  Patient Name: [facility] WCHJS'C Date: 03/20/2022 Reason for consult: Follow-up assessment;Early term 37-38.6wks;Infant < 6lbs;Multiple gestation;1st time breastfeeding Age:22 hours  Twins [redacted]w[redacted]d.  < 6 lbs.  Mother states baby girl B wants to be breastfeed for longer than 10 min.  Suggest giving baby bottle first and then if she has energy allow her to breastfeed for 10-15 min as long as she is actively sucking. Twins recently each took 30 ml via extra slow flow nipple. Mother has ordered breastpumps. Suggest calling next time infant wants to breastfeed.  Feeding Mother's Current Feeding Choice: Breast Milk and Donor Milk   Interventions Interventions: Education;DEBP  Discharge Pump: Personal (Pumps ordered from [employer])  Consult Status Consult Status: Follow-up Date: 03/21/22 Follow-up type: In-patient    Dahlia Byes [facility] 03/20/2022, 11:27 AM

## 2022-03-21 ENCOUNTER — Ambulatory Visit: Payer: Self-pay

## 2022-03-21 LAB — TYPE AND SCREEN
ABO/RH(D): B POS
Antibody Screen: NEGATIVE
Unit division: 0
Unit division: 0

## 2022-03-21 LAB — BPAM RBC
Blood Product Expiration Date: 202308052359
Blood Product Expiration Date: 202308052359
ISSUE DATE / TIME: 202307062050
Unit Type and Rh: 7300
Unit Type and Rh: 7300

## 2022-03-21 NOTE — Lactation Note (Signed)
This note was copied from a baby's chart. Lactation Consultation Note  Patient Name: Sylvia Walter [facility] DUKGU'R Date: 03/21/2022   Age:22 days  Consult Status Consult Status: Complete (mother declined follow up) per S. Lisbeth Ply, RN  Lurline Hare [facility] 03/21/2022, 10:35 AM

## 2022-03-21 NOTE — Lactation Note (Signed)
This note was copied from a baby's chart. Lactation Consultation Note  Patient Name: Sylvia Walter [facility] [id number]'N Date: 03/21/2022   Age:22 days  Consult Status Consult Status: Complete (mother declined follow up) per S. Lisbeth Ply, Charity fundraiser.    Lurline Hare [facility] 03/21/2022, 10:33 AM

## 2022-03-21 NOTE — Lactation Note (Signed)
This note was copied from a baby's chart. Lactation Consultation Note  Patient Name: Sylvia Walter [facility] BJYNW'G Date: 03/21/2022 Reason for consult: Other (Comment) Age:22 days  Mom was provided hand-out from the CDC: Protecting your Baby from Cronobacter and discussed.   Interventions Interventions: Education  Discharge Discharge Education: Outpatient recommendation;Outpatient Epic message sent Pump: [facility]  Consult Status Consult Status: Complete    Remigio Eisenmenger 03/21/2022, 1:10 PM

## 2022-03-21 NOTE — Lactation Note (Signed)
This note was copied from a baby's chart. Lactation Consultation Note  Patient Name: Sylvia Walter [facility] FYTWK'M Date: 03/21/2022 Reason for consult: Other (Comment) [facility] Loaner) Age:22 days  Mom was provided a [facility]. Sometimes Mom offers breast first, sometimes she offers bottle first. I encouraged Mom to monitor softening of breast to determine if she needs to pump after feedings and to assess milk transfer for managing her breasts. Referral sent via Epic for outpatient lactation appt to assess milk transfer.   "Bottle-feeding your baby" hand-out with parameters provided; Mom encouraged to feed infants until content.   Mom's questions were answered to her satisfaction, but knows how to reach [place] for post-discharge questions.   Interventions Interventions: Education  Discharge Pump: [facility]  Consult Status Consult Status: Complete   Remigio Eisenmenger 03/21/2022, 12:41 PM

## 2022-03-23 ENCOUNTER — Encounter: Payer: BC Managed Care – PPO | Admitting: Medical

## 2022-03-25 ENCOUNTER — Ambulatory Visit (INDEPENDENT_AMBULATORY_CARE_PROVIDER_SITE_OTHER): Payer: BC Managed Care – PPO

## 2022-03-25 VITALS — BP 124/76 | HR 86

## 2022-03-25 DIAGNOSIS — Z0130 Encounter for examination of blood pressure without abnormal findings: Secondary | ICD-10-CM

## 2022-03-25 MED ORDER — MEDROXYPROGESTERONE ACETATE 150 MG/ML IM SUSP
150.0000 mg | INTRAMUSCULAR | Status: DC
Start: 2022-03-25 — End: 2022-03-25

## 2022-03-25 NOTE — Progress Notes (Signed)
..  Subjective:  Sylvia Walter is a 21 y.o. female here for BP check.   Hypertension ROS: taking medications as instructed, no medication side effects noted, no TIA's, no chest pain on exertion, no dyspnea on exertion, and no swelling of ankles.    Objective:  BP 124/76   Pulse 86   Breastfeeding Yes   Appearance alert, well appearing, and in no distress. General exam BP noted to be well controlled today in office.    Assessment:   Blood Pressure well controlled.   Plan:  Current treatment plan is effective, no change in therapy. PP visit is scheduled for 05/06/22.

## 2022-03-26 ENCOUNTER — Telehealth: Payer: Self-pay

## 2022-03-26 NOTE — Telephone Encounter (Signed)
Written order for Breast Pump signed and faxed to [employer] on 03/26/22 

## 2022-03-30 ENCOUNTER — Encounter: Payer: BC Managed Care – PPO | Admitting: Obstetrics and Gynecology

## 2022-04-01 ENCOUNTER — Ambulatory Visit: Payer: Self-pay

## 2022-04-01 NOTE — Lactation Note (Addendum)
This note was copied from a baby's chart. Birth parent of gestational twins at 15 weeks of age.  Birth parent in midst of cluster feeding with infants going to breast or crying for more volume after nursing.  We talked about how much she was pumping, volume getting with EBM and how much she offered with formula.  Birth parent at times putting infant at the breast and not supplementing. Then within the hour she is offering more pumped milk or formula.  We talked about supplementing after each feeding to get infants into a pattern, get more volume and she can get more rest.   Infants gaining weight and doing well. Mom using level 1 nipple and we reviewed pace bottle feeding. Birth parent like a follow up appt with outpatient LC to review latch and supplementation volumes as well as use of personal pump and flange size.  All questions answered in midst of the talk.

## 2022-05-06 ENCOUNTER — Ambulatory Visit (INDEPENDENT_AMBULATORY_CARE_PROVIDER_SITE_OTHER): Payer: BC Managed Care – PPO | Admitting: Student

## 2022-05-06 MED ORDER — DROSPIRENONE-ETHINYL ESTRADIOL 3-0.02 MG PO TABS: 1.0000 | ORAL_TABLET | Freq: Every day | ORAL | 11 refills | Status: DC

## 2022-05-06 NOTE — Progress Notes (Signed)
Post Partum Visit Note  Sylvia Walter is a 21 y.o. G75P1002 female who presents for a postpartum visit. She is 7 week postpartum following a normal spontaneous vaginal delivery.  I have fully reviewed the prenatal and intrapartum course. The delivery was at 37 gestational weeks.  Anesthesia: epidural. Postpartum course has been unremarkable. Baby is doing well. Baby is feeding by both breast and bottle - Similac with Iron Total Comfort. Bleeding no bleeding. Bowel function is normal. Bladder function is normal. Patient is not sexually active. Contraception method is  undecided . Postpartum depression screening: negative.  Patient concerned for painful white spot on left nipple. Reports pain when babies are actively feeding. Denies any redness, swelling, or fever like symptoms.   The pregnancy intention screening data noted above was reviewed. Potential methods of contraception were discussed. The patient elected to proceed with No data recorded.   Edinburgh Postnatal Depression Scale - 05/06/22 1117       Edinburgh Postnatal Depression Scale:  In the Past 7 Days   I have been able to laugh and see the funny side of things. 0    I have looked forward with enjoyment to things. 0    I have blamed myself unnecessarily when things went wrong. 0    I have been anxious or worried for no good reason. 2    I have felt scared or panicky for no good reason. 2    Things have been getting on top of me. 0    I have been so unhappy that I have had difficulty sleeping. 0    I have felt sad or miserable. 0    I have been so unhappy that I have been crying. 1    The thought of harming myself has occurred to me. 0    Edinburgh Postnatal Depression Scale Total 5             Health Maintenance Due  Topic Date Due   COVID-19 Vaccine (1) Never done   URINE MICROALBUMIN  Never done   HPV VACCINES (1 - 2-dose series) Never done   INFLUENZA VACCINE  04/07/2022    The following portions of the  patient's history were reviewed and updated as appropriate: allergies, current medications, past family history, past medical history, past social history, past surgical history, and problem list.  Review of Systems Pertinent items are noted in HPI. Pertinent items noted in HPI and remainder of comprehensive ROS otherwise negative.  Objective:  BP 118/76   Pulse 83   Ht 5\' 6"  (1.676 m)   Wt 177 lb 12.8 oz (80.6 kg)   LMP 04/29/2022   Breastfeeding Yes   BMI 28.70 kg/m    General:  alert, cooperative, appears stated age, and no distress   Breasts:  abnormal milk blister on left nipple, otherwise normal lactating breasts bilaterally. No redness, edema, cracking or lumps  Lungs: Normal work of breathing  Heart:  regular rate and rhythm  Abdomen: soft, non-tender; bowel sounds normal; no masses,  no organomegaly   Wound N/a  GU exam:  not indicated       Assessment:    There are no diagnoses linked to this encounter.  Normal postpartum exam.   Plan:   Essential components of care per ACOG recommendations:  1.  Mood and well being: Patient with negative depression screening today. Reviewed local resources for support.  - Patient tobacco use? No.   - hx of drug use? No.  2. Infant care and feeding:  -Patient currently breastmilk feeding? Yes. Reviewed importance of draining breast regularly to support lactation. Provided with written information on management of milk blister -Social determinants of health (SDOH) reviewed in EPIC. No concerns. The following needs were identified: patient needs a new breast pump  3. Sexuality, contraception and birth spacing - Patient does not want a pregnancy in the next year.  Desired family size is 2 children.  - Reviewed reproductive life planning. Reviewed contraceptive methods based on pt preferences and effectiveness.  Patient desired Oral Contraceptive today.  Patient desires mood regulation and reliable method of contraception. Patient  prefers taking a tablet, but is concerned with POPs short half life. Risks and benefits of CHCs discussed. Patient encouraged to closely monitor breast milk supply.  - Discussed birth spacing of 18 months  4. Sleep and fatigue -Encouraged family/partner/community support of 4 hrs of uninterrupted sleep to help with mood and fatigue  5. Physical Recovery  - Discussed patients delivery and complications. She describes her labor as good. - Patient had a Vaginal, no problems at delivery. Patient had a 1st degree laceration. Perineal healing reviewed. Patient expressed understanding - Patient has urinary incontinence? Yes. Discussed role of pelvic floor PT. Offered PT and patient accepted. Patient was referred to pelvic floor PT.  - Patient is safe to resume physical and sexual activity  6.  Health Maintenance - HM due items addressed Yes - Last pap smear  Diagnosis  Date Value Ref Range Status  10/01/2021 - Low grade squamous intraepithelial lesion (LSIL) (A)  Final   Pap smear not done at today's visit.  -Breast Cancer screening indicated? No.   7. Chronic Disease/Pregnancy Condition follow up: Abnormal pap smear - Needs repeat PAP 09/2022  - PCP follow up  Corlis Hove, NP [facility], [facility] [id number]

## 2022-05-06 NOTE — Progress Notes (Deleted)
    Post Partum Visit Note  Sylvia Walter is a 21 y.o. G79P1002 female who presents for a postpartum visit. She is 7 weeks postpartum following a normal spontaneous vaginal delivery.  I have fully reviewed the prenatal and intrapartum course. The delivery was at [redacted]w[redacted]d.  Anesthesia: local and epidural. Postpartum course has been ***. Baby is doing well***. Baby is feeding by {breastmilk/bottle:69}. Bleeding {vag bleed:12292}. Bowel function is {normal:32111}. Bladder function is {normal:32111}. Patient {is/is not:9024} sexually active. Contraception method is {contraceptive method:5051}. Postpartum depression screening: {gen negative/positive:315881}.   The pregnancy intention screening data noted above was reviewed. Potential methods of contraception were discussed. The patient elected to proceed with No data recorded.    Health Maintenance Due  Topic Date Due   COVID-19 Vaccine (1) Never done   URINE MICROALBUMIN  Never done   HPV VACCINES (1 - 2-dose series) Never done   INFLUENZA VACCINE  04/07/2022    {Common ambulatory SmartLinks:19316}  Review of Systems {ros; complete:30496}  Objective:  There were no vitals taken for this visit.   General:  {gen appearance:16600}   Breasts:  {desc; normal/abnormal/not indicated:14647}  Lungs: {lung exam:16931}  Heart:  {heart exam:5510}  Abdomen: {abdomen exam:16834}   Wound {Wound assessment:11097}  GU exam:  {desc; normal/abnormal/not indicated:14647}       Assessment:    There are no diagnoses linked to this encounter.  *** postpartum exam.   Plan:   Essential components of care per ACOG recommendations:  1.  Mood and well being: Patient with {gen negative/positive:315881} depression screening today. Reviewed local resources for support.  - Patient tobacco use? {tobacco use:25506}  - hx of drug use? {yes/no:25505}    2. Infant care and feeding:  -Patient currently breastmilk feeding? {yes/no:25502}  -Social determinants  of health (SDOH) reviewed in EPIC. No concerns***The following needs were identified***  3. Sexuality, contraception and birth spacing - Patient {DOES_DOES XLK:44010} want a pregnancy in the next year.  Desired family size is {NUMBER 1-10:22536} children.  - Reviewed reproductive life planning. Reviewed contraceptive methods based on pt preferences and effectiveness.  Patient desired {Upstream End Methods:24109} today.   - Discussed birth spacing of 18 months  4. Sleep and fatigue -Encouraged family/partner/community support of 4 hrs of uninterrupted sleep to help with mood and fatigue  5. Physical Recovery  - Discussed patients delivery and complications. She describes her labor as {description:25511} - Patient had a {CHL AMB DELIVERY:[phone]}. Patient had a {laceration:25518} laceration. Perineal healing reviewed. Patient expressed understanding - Patient has urinary incontinence? {yes/no:25515} - Patient {ACTION; IS/IS UVO:[id number]} safe to resume physical and sexual activity  6.  Health Maintenance - HM due items addressed {Yes or If no, why not?:20788} - Last pap smear  Diagnosis  Date Value Ref Range Status  10/01/2021 - Low grade squamous intraepithelial lesion (LSIL) (A)  Final   Pap smear {done:10129} at today's visit.  -Breast Cancer screening indicated? {indicated:25516}  7. Chronic Disease/Pregnancy Condition follow up: {Follow up:25499}  - PCP follow up  Bayard Beaver, LPN [facility] [employer], [facility]

## 2022-05-06 NOTE — Patient Instructions (Addendum)
How do you treat a milk blister? [email or web address] \  Recommended treatment for a milk blister usually consists of four steps: apply moist heat prior to nursing, clear the skin from the milk duct, nurse or pump with a hospital-grade pump, than follow up with medication to aid healing. You may need to repeat this for several days (or longer) until the plugged duct opening stays clear. Following are more detailed suggestions. 1. Apply moist heat to soften the blister prior to nursing. Several times per day, add a saline soak prior to applying the moist heat. An epsom-salt soak before breastfeeding helps to open the milk duct opening and also aids in healing. Use a solution of epsom salt -- 2 teaspoons to 1 cup water. The epsom salt is first dissolved in a small amount of very hot water, then further water is added to cool it down enough to soak in. Try to add this epsom-salt soak to your routine at least 4 times per day. Prior to nursing (and directly after the epsom-salt soak) place an extremely hot wet compress on the milk blister immediately before nursing or pumping. Be careful not to burn yourself. A cotton ball soaked with olive oil can be used to soften the skin instead of the wet compress. 2. Clear the skin from the milk duct. This may not be necessary, as the combination of the heat and nursing/pumping should cause the skin to expand and the blister to open. However, it can be helpful to do one of the following at least once per day until skin no longer grows over the duct. Rub the blister area with a moist washcloth. If a plug is protruding from the nipple, you can gently pull on it with clean fingers. Loosen an edge of the blister by gently scraping with your fingernail. If the above methods do not work, a sterile needle may also be used to open the blister. To minimize the risk for infection, ask your health care provider to do this (do not do this on your own). There is a much greater risk of  infection if you do it yourself. First, wash the area well with soap and water; pat dry. Use a sterile needle to lift the skin at the edge of the blister. If a sterile needle is not available, sterilize needle with an autoclave or commercial sterilizing solution, by holding in a match flame until red hot (cool before using), or by soaking 10-15 minutes in rubbing alcohol. Use a lifting action, at the edge of the bleb, rather than a piercing action. Don't push into the blister as it can push bacteria deeper into the nipple. If there is any loose blister-like skin, your health care provider may need to remove that also, using sterile tweezers and small sharp scissors to entirely remove the excess skin. Follow up with a soap and water wash (and be sure to use an antibiotic ointment after nursing). See Healing broken skin in the nipple area. 3. Nurse or pump with a hospital-grade pump. Nurse first on the breast with the milk blister, directly after applying heat. Before you nurse, it can be helpful to use breast compression and attempt to hand express back behind and down toward the nipple to release any thickened milk that has backed up in the duct. Sometimes clumps or strings of hardened milk (often of a toothpaste consistency) can be expressed from this duct. 4. Treat the milk blister after nursing to aid healing. See Healing broken skin the  nipple area [top] Additional treatments for recurring milk blisters Lecithin supplements can help to heal and prevent recurrent plugged ducts Massaging the breast, areola and nipple with a massage oil containing grapefruit seed extract (GSE) can help to heal recurrent milk blisters. To make the massage oil: mix a few drops of grapefruit seed extract or citrus seed extract into olive oil. Another treatment for persistent milk blisters: Once per day, spray breast and nipple area with a solution consisting of 5 drops of grapefruit seed extract, 1/4 cup vinegar, and 2 cups  water. Vitamin E ointment - applied very sparingly and wiped off before feedings (too much vitamin E can be toxic to baby) - can also help.

## 2022-06-04 ENCOUNTER — Ambulatory Visit: Payer: BC Managed Care – PPO | Attending: Student | Admitting: Physical Therapy

## 2022-06-04 ENCOUNTER — Encounter: Payer: Self-pay | Admitting: Physical Therapy

## 2022-06-04 DIAGNOSIS — M6281 Muscle weakness (generalized): Secondary | ICD-10-CM | POA: Diagnosis present

## 2022-06-04 DIAGNOSIS — M25552 Pain in left hip: Secondary | ICD-10-CM | POA: Diagnosis present

## 2022-06-04 DIAGNOSIS — R293 Abnormal posture: Secondary | ICD-10-CM | POA: Insufficient documentation

## 2022-06-04 DIAGNOSIS — M62838 Other muscle spasm: Secondary | ICD-10-CM | POA: Insufficient documentation
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
## 2022-06-11 ENCOUNTER — Ambulatory Visit: Payer: BC Managed Care – PPO | Admitting: Physical Therapy

## 2022-06-15 ENCOUNTER — Ambulatory Visit: Payer: BC Managed Care – PPO | Admitting: Physical Therapy

## 2022-06-24 ENCOUNTER — Ambulatory Visit: Payer: BC Managed Care – PPO | Attending: Student | Admitting: Physical Therapy

## 2022-06-24 DIAGNOSIS — R293 Abnormal posture: Secondary | ICD-10-CM | POA: Insufficient documentation

## 2022-06-24 DIAGNOSIS — M6281 Muscle weakness (generalized): Secondary | ICD-10-CM | POA: Diagnosis not present

## 2022-06-24 DIAGNOSIS — M62838 Other muscle spasm: Secondary | ICD-10-CM | POA: Insufficient documentation

## 2022-06-24 DIAGNOSIS — M25552 Pain in left hip: Secondary | ICD-10-CM | POA: Diagnosis present
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
## 2022-07-08 ENCOUNTER — Ambulatory Visit: Payer: BC Managed Care – PPO | Attending: Student | Admitting: Physical Therapy

## 2022-07-08 DIAGNOSIS — M25552 Pain in left hip: Secondary | ICD-10-CM | POA: Insufficient documentation

## 2022-07-08 DIAGNOSIS — M6281 Muscle weakness (generalized): Secondary | ICD-10-CM | POA: Diagnosis not present

## 2022-07-08 DIAGNOSIS — R293 Abnormal posture: Secondary | ICD-10-CM | POA: Insufficient documentation

## 2022-07-08 DIAGNOSIS — M62838 Other muscle spasm: Secondary | ICD-10-CM | POA: Insufficient documentation

## 2022-07-20 ENCOUNTER — Ambulatory Visit: Payer: BC Managed Care – PPO | Admitting: Physical Therapy

## 2022-07-20 ENCOUNTER — Encounter: Payer: Self-pay | Admitting: Physical Therapy

## 2022-07-20 DIAGNOSIS — M25552 Pain in left hip: Secondary | ICD-10-CM

## 2022-07-20 DIAGNOSIS — R293 Abnormal posture: Secondary | ICD-10-CM

## 2022-07-20 DIAGNOSIS — M6281 Muscle weakness (generalized): Secondary | ICD-10-CM | POA: Diagnosis not present

## 2022-07-20 DIAGNOSIS — M62838 Other muscle spasm: Secondary | ICD-10-CM
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
## 2022-07-25 ENCOUNTER — Other Ambulatory Visit (HOSPITAL_COMMUNITY): Payer: Self-pay

## 2022-07-26 ENCOUNTER — Other Ambulatory Visit (HOSPITAL_COMMUNITY): Payer: Self-pay

## 2022-07-27 ENCOUNTER — Other Ambulatory Visit (HOSPITAL_COMMUNITY): Payer: Self-pay

## 2022-08-06 ENCOUNTER — Ambulatory Visit: Payer: BC Managed Care – PPO | Admitting: Physical Therapy
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
## 2022-08-12 ENCOUNTER — Ambulatory Visit: Payer: BC Managed Care – PPO | Admitting: Physical Therapy

## 2022-08-20 ENCOUNTER — Ambulatory Visit: Payer: BC Managed Care – PPO | Admitting: Physical Therapy

## 2022-08-20 ENCOUNTER — Telehealth: Payer: Self-pay

## 2022-08-20 NOTE — Telephone Encounter (Signed)
Left a voicemail for pt to cancel appt toay at 3:30 due to provider being out of the office.

## 2022-08-24 ENCOUNTER — Ambulatory Visit: Payer: BC Managed Care – PPO | Attending: Student | Admitting: Physical Therapy

## 2022-08-24 ENCOUNTER — Encounter: Payer: Self-pay | Admitting: Physical Therapy

## 2022-08-24 DIAGNOSIS — M25552 Pain in left hip: Secondary | ICD-10-CM | POA: Insufficient documentation

## 2022-08-24 DIAGNOSIS — M6281 Muscle weakness (generalized): Secondary | ICD-10-CM | POA: Diagnosis present

## 2022-08-24 DIAGNOSIS — R293 Abnormal posture: Secondary | ICD-10-CM | POA: Insufficient documentation

## 2022-08-24 DIAGNOSIS — M62838 Other muscle spasm: Secondary | ICD-10-CM | POA: Insufficient documentation
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
## 2022-09-21 ENCOUNTER — Ambulatory Visit: Payer: BC Managed Care – PPO | Attending: Student | Admitting: Physical Therapy

## 2022-09-21 ENCOUNTER — Telehealth: Payer: Self-pay | Admitting: Physical Therapy

## 2022-09-21 DIAGNOSIS — R293 Abnormal posture: Secondary | ICD-10-CM | POA: Insufficient documentation

## 2022-09-21 DIAGNOSIS — M62838 Other muscle spasm: Secondary | ICD-10-CM | POA: Insufficient documentation

## 2022-09-21 DIAGNOSIS — M25552 Pain in left hip: Secondary | ICD-10-CM | POA: Insufficient documentation

## 2022-09-21 DIAGNOSIS — M6281 Muscle weakness (generalized): Secondary | ICD-10-CM | POA: Insufficient documentation
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
## 2022-09-21 NOTE — Telephone Encounter (Signed)
Patient did not show for appointment.  Patient was called and PT left message to please call us back.  Gustavus Bryant, PT, DPT 09/21/22 4:04 PM

## 2022-10-12 ENCOUNTER — Ambulatory Visit: Payer: BC Managed Care – PPO | Attending: Student | Admitting: Physical Therapy

## 2022-10-12 DIAGNOSIS — M6281 Muscle weakness (generalized): Secondary | ICD-10-CM | POA: Insufficient documentation

## 2022-10-12 DIAGNOSIS — M62838 Other muscle spasm: Secondary | ICD-10-CM | POA: Insufficient documentation

## 2022-10-12 DIAGNOSIS — M25552 Pain in left hip: Secondary | ICD-10-CM | POA: Diagnosis present

## 2022-10-12 DIAGNOSIS — R293 Abnormal posture: Secondary | ICD-10-CM | POA: Insufficient documentation
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
## 2022-11-02 ENCOUNTER — Encounter: Payer: BC Managed Care – PPO | Admitting: Physical Therapy

## 2022-11-23 ENCOUNTER — Encounter: Payer: BC Managed Care – PPO | Admitting: Physical Therapy

## 2023-01-22 ENCOUNTER — Other Ambulatory Visit: Payer: Self-pay

## 2023-01-22 ENCOUNTER — Emergency Department (HOSPITAL_COMMUNITY)
Admission: EM | Admit: 2023-01-22 | Discharge: 2023-01-23 | Disposition: A | Discharge status: Home / Self Care | Payer: BC Managed Care – PPO | Attending: Emergency Medicine | Admitting: Emergency Medicine

## 2023-01-22 DIAGNOSIS — M25561 Pain in right knee: Secondary | ICD-10-CM

## 2023-01-22 NOTE — ED Triage Notes (Signed)
Pt c/o right knee pain that has started starting running x 1 week. Difficulty with ambulation.

## 2023-01-23 ENCOUNTER — Emergency Department (HOSPITAL_COMMUNITY): Payer: BC Managed Care – PPO

## 2023-01-23 DIAGNOSIS — M25561 Pain in right knee: Secondary | ICD-10-CM | POA: Diagnosis not present

## 2023-01-23 MED ORDER — ACETAMINOPHEN 325 MG PO TABS
650.0000 mg | ORAL_TABLET | Freq: Four times a day (QID) | ORAL | Status: DC | PRN
  Administered 2023-01-23: 650 mg via ORAL
  Filled 2023-01-23: qty 2

## 2023-01-23 NOTE — ED Provider Notes (Signed)
[place] EMERGENCY DEPARTMENT AT [facility] Provider Note   CSN: [id number] Arrival date & time: 01/22/23  2251     History  Chief Complaint  Patient presents with   Knee Pain    R    Zira R Konishi is a 22 y.o. female.  The history is provided by the patient.  Knee Pain Edwina R Worth is a 22 y.o. female who presents to the Emergency Department complaining of knee pain.  She presents to the emergency department for evaluation of right knee pain.  She states that a couple of weeks ago she started running again.  She developed pain in her right knee with running and she decided to stop 1 week ago.  She was only running about 1 mile with her twins in a stroller.  She is about 1 year postpartum.  She is currently breast-feeding.  She has pain on walking down the stairs as well as a burning pain at rest on the anterior and medial portion of the left knee.  No associated hip pain.  She does have a history of prior labral tear with her pregnancy.       Home Medications Prior to Admission medications   Medication Sig Start Date End Date Taking? Authorizing Provider  drospirenone-ethinyl estradiol (YAZ) 3-0.02 MG tablet Take 1 tablet by mouth daily. 05/06/22   Corlis Hove, NP  ferrous sulfate 325 (65 FE) MG tablet Take 1 tablet (325 mg total) by mouth every other day. Patient not taking: Reported on 05/06/2022 03/20/22 03/20/23  Warner Mccreedy, MD  furosemide (LASIX) 20 MG tablet Take 1 tablet (20 mg total) by mouth daily. Patient not taking: Reported on 05/06/2022 03/20/22   Glendale Chard, DO  ibuprofen (ADVIL) 600 MG tablet Take 1 tablet (600 mg total) by mouth every 6 (six) hours. 03/20/22   Glendale Chard, DO  NIFEdipine (ADALAT CC) 30 MG 24 hr tablet Take 1 tablet (30 mg total) by mouth daily. Patient not taking: Reported on 05/06/2022 03/20/22   Glendale Chard, DO  Prenat-FeFum-DSS-FA-DHA w/o A (PNV-DHA+DOCUSATE) 27-1.25-300 MG CAPS Take 1 capsule by mouth daily before  breakfast. Patient not taking: Reported on 05/06/2022 12/24/21   Brock Bad, MD  vitamin B-12 (CYANOCOBALAMIN) 500 MCG tablet Take 500 mcg by mouth daily.    [provider]      Allergies    Other    Review of Systems   Review of Systems  All other systems reviewed and are negative.   Physical Exam Updated Vital Signs BP (!) 142/97   Pulse 80   Temp 98.8 F (37.1 C) (Oral)   Resp 16   Ht 5\' 8"  (1.727 m)   Wt 90.7 kg   LMP 01/04/2023   SpO2 99%   Breastfeeding Yes   BMI 30.41 kg/m  Physical Exam Vitals and nursing note reviewed.  Constitutional:      Appearance: She is well-developed.  HENT:     Head: Normocephalic and atraumatic.  Cardiovascular:     Rate and Rhythm: Normal rate and regular rhythm.  Pulmonary:     Effort: Pulmonary effort is normal. No respiratory distress.  Musculoskeletal:     Comments: There is mild tenderness to palpation over the right knee without any overlying erythema.  No palpable joint effusion.  She is able to fully flex and extend at the knee.  Range of motion in the hip, ankle are normal.  2+ DP pulses bilaterally.  Skin:    General: Skin  is warm and dry.  Neurological:     Mental Status: She is alert and oriented to person, place, and time.  Psychiatric:        Behavior: Behavior normal.     ED Results / Procedures / Treatments   Labs (all labs ordered are listed, but only abnormal results are displayed) Labs Reviewed - No data to display  EKG None  Radiology DG Knee Complete 4 Views Right  Result Date: 01/23/2023 CLINICAL DATA:  Right knee pain. EXAM: RIGHT KNEE - COMPLETE 4+ VIEW COMPARISON:  None Available. FINDINGS: No evidence of fracture, dislocation, or joint effusion. No evidence of arthropathy or other focal bone abnormality. Soft tissues are unremarkable. IMPRESSION: Negative. Electronically Signed   By: Elgie Collard M.D.   On: 01/23/2023 00:29    Procedures Procedures    Medications Ordered  in ED Medications  acetaminophen (TYLENOL) tablet 650 mg (has no administration in time range)    ED Course/ Medical Decision Making/ A&P                             Medical Decision Making Amount and/or Complexity of Data Reviewed Radiology: ordered.  Risk OTC drugs.   Patient here for evaluation of right knee pain after starting running few weeks ago.  She is neurovascularly intact on evaluation with no evidence of soft tissue infection.  Plain films are negative for acute fracture, dislocation-images personally reviewed and interpreted, agree with radiologist interpretation.  Discussed with patient home care for knee pain after starting running.  She can apply a knee sleeve for comfort.  Discussed acetaminophen as needed pain.  Discussed outpatient follow-up.        Final Clinical Impression(s) / ED Diagnoses Final diagnoses:  Acute pain of right knee    Rx / DC Orders ED Discharge Orders     None         Tilden Fossa, MD 01/23/23 0130

## 2023-04-20 ENCOUNTER — Other Ambulatory Visit (HOSPITAL_COMMUNITY)
Admission: RE | Admit: 2023-04-20 | Discharge: 2023-04-20 | Disposition: A | Discharge status: Home / Self Care | Payer: BC Managed Care – PPO | Source: Ambulatory Visit | Attending: Obstetrics and Gynecology | Admitting: Obstetrics and Gynecology

## 2023-04-20 ENCOUNTER — Ambulatory Visit: Payer: BC Managed Care – PPO | Admitting: Obstetrics and Gynecology

## 2023-04-20 ENCOUNTER — Encounter: Payer: Self-pay | Admitting: Obstetrics and Gynecology

## 2023-04-20 VITALS — BP 114/68 | HR 75 | Ht 67.0 in | Wt 195.0 lb

## 2023-04-20 DIAGNOSIS — Z01419 Encounter for gynecological examination (general) (routine) without abnormal findings: Secondary | ICD-10-CM | POA: Diagnosis not present

## 2023-04-20 DIAGNOSIS — N912 Amenorrhea, unspecified: Secondary | ICD-10-CM | POA: Diagnosis not present

## 2023-04-20 DIAGNOSIS — Z113 Encounter for screening for infections with a predominantly sexual mode of transmission: Secondary | ICD-10-CM

## 2023-04-20 DIAGNOSIS — F32A Depression, unspecified: Secondary | ICD-10-CM | POA: Diagnosis not present

## 2023-04-20 DIAGNOSIS — Z124 Encounter for screening for malignant neoplasm of cervix: Secondary | ICD-10-CM | POA: Insufficient documentation

## 2023-04-20 DIAGNOSIS — Z3009 Encounter for other general counseling and advice on contraception: Secondary | ICD-10-CM

## 2023-04-20 NOTE — Progress Notes (Unsigned)
Pt presents for AEX.  Last PAP 10-01-21 LSIL Declines STD testing Pt c/o amenorrhea for 7 months. [facility]

## 2023-04-20 NOTE — Progress Notes (Unsigned)
GYNECOLOGY ANNUAL PREVENTATIVE CARE ENCOUNTER NOTE  Subjective:   Sylvia Walter is a 22 y.o. G40P1002 female here for a annual gynecologic exam. Current complaints: has not had a period in a while. Had a period in February of this year, has taken multiple pregnancy tests and has been on birth control. Stopped Yaz 12/2022 and had spotting after but has not had anything since. Not currently on birth control. Still breastfeeding from 03/2022 delivery, feeding twins at least 6x per twin per day.   Denies abnormal vaginal discharge, pelvic pain, problems with intercourse or other gynecologic concerns. Has been sexually active after delivery but none recently. Pain with sex got better after PFPT.   Requests STI screen.   Gynecologic History No LMP recorded. Contraception: abstinence Last Pap: 09/2021. Results: LSIL Last mammogram: n/a Gardisil: unsure if she has received  Obstetric History OB History  Gravida Para Term Preterm AB Living  1 1 1     2   SAB IAB Ectopic Multiple Live Births        1 2    # Outcome Date GA Lbr Len/2nd Weight Sex Type Anes PTL Lv  1A Term 03/18/22 [redacted]w[redacted]d / 01:01 2.65 kg F Vag-Spont EPI  LIV     Birth Comments: wnl  1B Term 03/18/22 [redacted]w[redacted]d / 01:17 2.25 kg F Vag-Spont EPI  LIV    Past Medical History:  Diagnosis Date   ADHD    Alpha thalassemia silent carrier 10/25/2021    Past Surgical History:  Procedure Laterality Date   ROOT CANAL Left 07/31/2020   VAGINAL DELIVERY N/A 03/18/2022   Procedure: VAGINAL DELIVERY multiple gestation;  Surgeon: Tereso Newcomer, MD;  Location: [facility] LD ORS;  Service: Obstetrics;  Laterality: N/A;    Current Outpatient Medications on File Prior to Visit  Medication Sig Dispense Refill   ferrous sulfate 325 (65 FE) MG tablet Take 1 tablet (325 mg total) by mouth every other day. (Patient not taking: Reported on 05/06/2022) 30 tablet 3   ibuprofen (ADVIL) 600 MG tablet Take 1 tablet (600 mg total) by mouth every 6 (six) hours.  (Patient not taking: Reported on 04/20/2023) 30 tablet 0   Prenat-FeFum-DSS-FA-DHA w/o A (PNV-DHA+DOCUSATE) 27-1.25-300 MG CAPS Take 1 capsule by mouth daily before breakfast. (Patient not taking: Reported on 05/06/2022) 90 capsule 4   vitamin B-12 (CYANOCOBALAMIN) 500 MCG tablet Take 500 mcg by mouth daily. (Patient not taking: Reported on 04/20/2023)     No current facility-administered medications on file prior to visit.    Allergies  Allergen Reactions   Other Other (See Comments)    Grass/ pecans    Social History   Socioeconomic History   Marital status: Single    Spouse name: Not on file   Number of children: Not on file   Years of education: Not on file   Highest education level: Not on file  Occupational History   Not on file  Tobacco Use   Smoking status: Never   Smokeless tobacco: Never  Vaping Use   Vaping status: Never Used  Substance and Sexual Activity   Alcohol use: Not Currently    Comment: occ, prior to pregnancy   Drug use: Never   Sexual activity: Not Currently    Partners: Male    Birth control/protection: None  Other Topics Concern   Not on file  Social History Narrative   Not on file   Social Determinants of Health   Financial Resource Strain: Not on file  Food Insecurity: Not on file  Transportation Needs: Not on file  Physical Activity: Not on file  Stress: Not on file  Social Connections: Not on file  Intimate Partner Violence: Not on file    Family History  Problem Relation Age of Onset   Hypertension Mother      The following portions of the patient's history were reviewed and updated as appropriate: allergies, current medications, past family history, past medical history, past social history, past surgical history and problem list.  Review of Systems Pertinent items are noted in HPI.   Objective:  BP 114/68   Pulse 75   Ht 5\' 7"  (1.702 m)   Wt 88.5 kg   Breastfeeding Yes   BMI 30.54 kg/m  CONSTITUTIONAL: Well-developed,  well-nourished female in no acute distress.  HENT:  Normocephalic, atraumatic, External right and left ear normal. Oropharynx is clear and moist EYES: Conjunctivae and EOM are normal. Pupils are equal, round, and reactive to light. No scleral icterus.  NECK: Normal range of motion, supple, no masses.  Normal thyroid.  SKIN: Skin is warm and dry. No rash noted. Not diaphoretic. No erythema. No pallor. NEUROLOGIC: Alert and oriented to person, place, and time. Normal reflexes, muscle tone coordination. No cranial nerve deficit noted. PSYCHIATRIC: Normal mood and affect. Normal behavior. Normal judgment and thought content. CARDIOVASCULAR: Normal heart rate noted RESPIRATORY: Effort normal, no problems with respiration noted. BREASTS: Symmetric in size. No masses, skin changes, nipple drainage, or lymphadenopathy. ABDOMEN: Soft, no distention noted.  No tenderness, rebound or guarding.  PELVIC: Normal appearing external genitalia; normal appearing vaginal mucosa and cervix.  No abnormal discharge noted.  Normal uterine size, no other palpable masses, no uterine or adnexal tenderness. MUSCULOSKELETAL: Normal range of motion. No tenderness.  No cyanosis, clubbing, or edema.   Exam done with chaperone present.  Assessment and Plan:   1. Well woman exam Normal female exam  2. Amenorrhea - had periods until March, then spotting in April with stopping Yaz, by defn not technically amenorrhea but worried about no periods and wants to initiate workup - US PELVIC COMPLETE WITH TRANSVAGINAL; Future - TSH - Hemoglobin A1c - Follicle stimulating hormone - Luteinizing hormone - Estradiol - HCG, quantitative, pregnancy  3. Routine screening for STI (sexually transmitted infection) - Cervicovaginal ancillary only( [place]) - HIV antibody (with reflex) - Hepatitis C antibody - Hepatitis B surface antigen - RPR  4. Encounter for counseling regarding contraception Not on Yaz, currently  abstinent, does not want to be pregnant but wants to figure out cause for amenorrhea  5. Cervical cancer screening - Cytology - PAP( [place])  6. Depression, unspecified depression type - Ambulatory referral to Integrated Behavioral Health   Will follow up results of STI screen and manage accordingly. Encouraged improvement in diet and exercise.  Accepts STI screen. COVID vaccine  Mammogram n/a   Routine preventative health maintenance measures emphasized. Please refer to After Visit Summary for other counseling recommendations.     Baldemar Lenis, MD, [facility] Attending Center for [employer] [facility])

## 2023-04-22 ENCOUNTER — Ambulatory Visit (HOSPITAL_BASED_OUTPATIENT_CLINIC_OR_DEPARTMENT_OTHER)
Admission: RE | Admit: 2023-04-22 | Discharge: 2023-04-22 | Disposition: A | Discharge status: Home / Self Care | Payer: BC Managed Care – PPO | Source: Ambulatory Visit | Attending: Obstetrics and Gynecology | Admitting: Obstetrics and Gynecology

## 2023-04-22 DIAGNOSIS — N912 Amenorrhea, unspecified: Secondary | ICD-10-CM | POA: Insufficient documentation

## 2023-04-29 LAB — CYTOLOGY - PAP: Diagnosis: REACTIVE

## 2023-05-24 ENCOUNTER — Encounter: Payer: Self-pay | Admitting: Obstetrics and Gynecology

## 2023-05-24 ENCOUNTER — Ambulatory Visit (INDEPENDENT_AMBULATORY_CARE_PROVIDER_SITE_OTHER): Payer: BC Managed Care – PPO | Admitting: Obstetrics and Gynecology

## 2023-05-24 ENCOUNTER — Ambulatory Visit (INDEPENDENT_AMBULATORY_CARE_PROVIDER_SITE_OTHER): Payer: BC Managed Care – PPO | Admitting: Licensed Clinical Social Worker

## 2023-05-24 VITALS — BP 109/75 | HR 80 | Wt 195.0 lb

## 2023-05-24 DIAGNOSIS — F341 Dysthymic disorder: Secondary | ICD-10-CM

## 2023-05-24 DIAGNOSIS — N912 Amenorrhea, unspecified: Secondary | ICD-10-CM

## 2023-05-24 DIAGNOSIS — F4322 Adjustment disorder with anxiety: Secondary | ICD-10-CM

## 2023-05-24 MED ORDER — SERTRALINE HCL 25 MG PO TABS: 25.0000 mg | ORAL_TABLET | Freq: Every day | ORAL | 3 refills | Status: DC

## 2023-05-24 NOTE — Progress Notes (Signed)
Pt is in office for follow up results.  Pt has had labs and u/s. Pt still has not had a cycle. Pt is breastfeeding.

## 2023-05-24 NOTE — Progress Notes (Signed)
22 yo retuning for follow up on amenorrhea since March 2024. Patient reports feeling well. She has been abstinent since her delivery. She is not taking any medication on using contraception. She is still breastfeeding and pumping while at work. She reports that her milk supply for her 47-month old twins is well established and has not decreased. She reports experiencing breast engorgement at time. Patient also reports a history of depression, anxiety and ADHD. She was previously on abilify and is interested in meeting with a therapist to discuss medication management options. Patient admits to worsening depressive symptoms without suicidal ideations.  Past Medical History:  Diagnosis Date   ADHD    Alpha thalassemia silent carrier 10/25/2021   Past Surgical History:  Procedure Laterality Date   ROOT CANAL Left 07/31/2020   VAGINAL DELIVERY N/A 03/18/2022   Procedure: VAGINAL DELIVERY multiple gestation;  Surgeon: Tereso Newcomer, MD;  Location: [facility] LD ORS;  Service: Obstetrics;  Laterality: N/A;   Family History  Problem Relation Age of Onset   Hypertension Mother    Social History   Tobacco Use   Smoking status: Never   Smokeless tobacco: Never  Vaping Use   Vaping status: Never Used  Substance Use Topics   Alcohol use: Not Currently    Comment: occ, prior to pregnancy   Drug use: Never   ROS See pertinent in HPI. All other systems reviewed and non contributory Blood pressure 109/75, pulse 80, weight 195 lb (88.5 kg), currently breastfeeding. GENERAL: Well-developed, well-nourished female in no acute distress.  NEURO: alert and oriented x 3  A/P 22 yo with amenorrhea while breastfeeding - Amenorrhea likely secondary to lactation. Patient even reports experiencing vaginal spotting during a month when she decreased the frequency of pumping while at work.  - Advised patient to return if amenorrhea persists following cessation of breastfeeding - Patient referred to psychiatry and Rx  zoloft provided today - Patient advised to contact the office if no improvement in symptoms

## 2023-05-28 NOTE — BH Specialist Note (Signed)
Integrated Behavioral Health Follow Up In-Person Visit  MRN: [id number] Name: Sylvia Walter  Number of Integrated Behavioral Health Clinician visits: 1 Session Start time:  400pm Session End time: 454pm Total time in minutes: 54 mins in person   Types of Service: General Behavioral Integrated Care (BHI)  Interpretor:No. Interpretor Name and Language: none  Subjective: Sylvia Walter is a 22 y.o. female accompanied by n/a Patient was referred by self for depressed mood. Patient reports the following symptoms/concerns: decrease sleep, depressed mood  Duration of problem: approx one year; Severity of problem: mild  Objective: Mood: good and Affect: Appropriate Risk of harm to self or others: No plan to harm self or others  Life Context: Family and Social: lives with family and twin daughters  School/Work: employed Self-Care: n/a Life Changes: adjusting   Patient and/or Family's Strengths/Protective Factors: Concrete supports in place (healthy food, safe environments, etc.)  Goals Addressed: Patient will:  Reduce symptoms of: anxiety and stress   Increase knowledge and/or ability of: coping skills   Demonstrate ability to: Increase healthy adjustment to current life circumstances  Progress towards Goals: Ongoing  Interventions: Interventions utilized:  Supportive Counseling Standardized Assessments completed: Not Needed  Patient and/or Family Response: Sylvia Walter reports trouble sleeping due to exclusively breast feeding twins. Sylvia Walter reports famiy is supportive. Sylvia Walter reports feeling anxious when leaving twins   Assessment: Patient currently experiencing adjustment disorder with anxious mood .   Patient may benefit from integrated behavioral health.  Plan: Follow up with behavioral health clinician on : as needed  Behavioral recommendations: delegate task to prevent burnout, communicate needs with father of babies and supportive family members, prioritize  rest, continue with mindfulness and relaxation techniques  Referral(s): Integrated [employer] (In Clinic) "From scale of 1-10, how likely are you to follow plan?":    Gwyndolyn Saxon, LCSW

## 2023-06-18 ENCOUNTER — Other Ambulatory Visit: Payer: Self-pay | Admitting: Obstetrics and Gynecology

## 2023-09-06 ENCOUNTER — Ambulatory Visit (HOSPITAL_COMMUNITY): Payer: BC Managed Care – PPO | Admitting: Family

## 2023-09-09 ENCOUNTER — Encounter: Payer: Self-pay | Admitting: *Deleted

## 2023-09-09 ENCOUNTER — Other Ambulatory Visit: Payer: Self-pay

## 2023-09-09 ENCOUNTER — Ambulatory Visit
Admission: EM | Admit: 2023-09-09 | Discharge: 2023-09-09 | Disposition: A | Discharge status: Home / Self Care | Payer: Medicaid Other | Attending: Physician Assistant | Admitting: Physician Assistant

## 2023-09-09 DIAGNOSIS — J029 Acute pharyngitis, unspecified: Secondary | ICD-10-CM | POA: Diagnosis not present

## 2023-09-09 LAB — POCT RAPID STREP A (OFFICE): Rapid Strep A Screen: NEGATIVE

## 2023-09-09 NOTE — ED Triage Notes (Signed)
 Pt reports a sore throat and congestion for 3 days.

## 2023-09-09 NOTE — Discharge Instructions (Signed)
 Tylenol for pain.  Warm salt water gargles.  Lozenges for discomfort

## 2023-09-09 NOTE — ED Provider Notes (Signed)
 [facility]    CSN: [id number] Arrival date & time: 09/09/23  1111      History   Chief Complaint Chief Complaint  Patient presents with   Sore Throat   Nasal Congestion    HPI Sylvia Walter is a 23 y.o. female.   The history is provided by the patient. No language interpreter was used.  Sore Throat This is a new problem. The problem occurs constantly. Nothing aggravates the symptoms. Nothing relieves the symptoms. She has tried nothing for the symptoms.    Past Medical History:  Diagnosis Date   ADHD    Alpha thalassemia silent carrier 10/25/2021    Patient Active Problem List   Diagnosis Date Noted   Supervision of high risk pregnancy, antepartum 03/17/2022   Gestational diabetes mellitus (GDM) affecting pregnancy 03/04/2022   Alpha thalassemia silent carrier 10/25/2021   Dichorionic diamniotic twin pregnancy 10/01/2021   Nausea and vomiting during pregnancy 10/01/2021   Supervision of high-risk pregnancy 09/29/2021    Past Surgical History:  Procedure Laterality Date   ROOT CANAL Left 07/31/2020   VAGINAL DELIVERY N/A 03/18/2022   Procedure: VAGINAL DELIVERY multiple gestation;  Surgeon: Herchel Gloris LABOR, MD;  Location: [facility] LD [facility];  Service: Obstetrics;  Laterality: N/A;    OB History     Gravida  1   Para  1   Term  1   Preterm      AB      Living  2      SAB      IAB      Ectopic      Multiple  1   Live Births  2            Home Medications    Prior to Admission medications   Medication Sig Start Date End Date Taking? Authorizing Provider  sertraline  (ZOLOFT ) 25 MG tablet Take 1 tablet (25 mg total) by mouth daily. 05/24/23  Yes Constant, Peggy, MD  ferrous sulfate  325 (65 FE) MG tablet Take 1 tablet (325 mg total) by mouth every other day. Patient not taking: Reported on 05/06/2022 03/20/22 03/20/23  Virgilio Payor, MD  ibuprofen  (ADVIL ) 600 MG tablet Take 1 tablet (600 mg total) by mouth every 6 (six) hours. Patient  not taking: Reported on 04/20/2023 03/20/22   Cleotilde Perkins, DO  Prenat-FeFum-DSS-FA-DHA w/o A (PNV-DHA +DOCUSATE) 27-1.25-300 MG CAPS Take 1 capsule by mouth daily before breakfast. Patient not taking: Reported on 05/06/2022 12/24/21   Rudy Carlin LABOR, MD  vitamin B-12 (CYANOCOBALAMIN) 500 MCG tablet Take 500 mcg by mouth daily. Patient not taking: Reported on 04/20/2023    [provider]    Family History Family History  Problem Relation Age of Onset   Hypertension Mother     Social History Social History   Tobacco Use   Smoking status: Never   Smokeless tobacco: Never  Vaping Use   Vaping status: Never Used  Substance Use Topics   Alcohol use: Not Currently    Comment: occ, prior to pregnancy   Drug use: Never     Allergies   Other   Review of Systems Review of Systems  All other systems reviewed and are negative.    Physical Exam Triage Vital Signs ED Triage Vitals  Encounter Vitals Group     BP 09/09/23 1454 110/77     Systolic BP Percentile --      Diastolic BP Percentile --      Pulse Rate 09/09/23 1454  84     Resp 09/09/23 1454 18     Temp 09/09/23 1454 (!) 97.4 F (36.3 C)     Temp src --      SpO2 09/09/23 1454 99 %     Weight --      Height --      Head Circumference --      Peak Flow --      Pain Score 09/09/23 1451 5     Pain Loc --      Pain Education --      Exclude from Growth Chart --    No data found.  Updated Vital Signs BP 110/77   Pulse 84   Temp (!) 97.4 F (36.3 C)   Resp 18   LMP 10/08/2022 (Approximate) Comment: PT is breast feeding twins  SpO2 99%   Breastfeeding Yes   Visual Acuity Right Eye Distance:   Left Eye Distance:   Bilateral Distance:    Right Eye Near:   Left Eye Near:    Bilateral Near:     Physical Exam Vitals and nursing note reviewed.  Constitutional:      Appearance: She is well-developed.  HENT:     Head: Normocephalic.     Right Ear: Tympanic membrane normal.     Left Ear:  Tympanic membrane normal.     Mouth/Throat:     Mouth: Mucous membranes are moist.  Eyes:     Conjunctiva/sclera: Conjunctivae normal.  Cardiovascular:     Rate and Rhythm: Normal rate.  Pulmonary:     Effort: Pulmonary effort is normal.  Abdominal:     General: There is no distension.  Musculoskeletal:        General: Normal range of motion.     Cervical back: Normal range of motion.  Neurological:     Mental Status: She is alert and oriented to person, place, and time.      UC Treatments / Results  Labs (all labs ordered are listed, but only abnormal results are displayed) Labs Reviewed  POCT RAPID STREP A (OFFICE) - Normal    EKG   Radiology No results found.  Procedures Procedures (including critical care time)  Medications Ordered in UC Medications - No data to display  Initial Impression / Assessment and Plan / UC Course  I have reviewed the triage vital signs and the nursing notes.  Pertinent labs & imaging results that were available during my care of the patient were reviewed by me and considered in my medical decision making (see chart for details).     Strep screen is negative Final Clinical Impressions(s) / UC Diagnoses   Final diagnoses:  Viral pharyngitis     Discharge Instructions      Tylenol  for pain.  Warm salt water  gargles.  Lozenges for discomfort    ED Prescriptions   None    PDMP not reviewed this encounter. An After Visit Summary was printed and given to the patient.    Flint Sonny POUR, PA-C 09/09/23 2054

## 2023-09-13 ENCOUNTER — Other Ambulatory Visit: Payer: Self-pay

## 2023-09-13 ENCOUNTER — Ambulatory Visit (HOSPITAL_BASED_OUTPATIENT_CLINIC_OR_DEPARTMENT_OTHER): Payer: Medicaid Other | Admitting: Family

## 2023-09-13 ENCOUNTER — Encounter (HOSPITAL_COMMUNITY): Payer: Self-pay | Admitting: Family

## 2023-09-13 VITALS — BP 113/75 | HR 83 | Ht 67.0 in | Wt 200.0 lb

## 2023-09-13 DIAGNOSIS — F4323 Adjustment disorder with mixed anxiety and depressed mood: Secondary | ICD-10-CM

## 2023-09-13 MED ORDER — SERTRALINE HCL 50 MG PO TABS: 50.0000 mg | ORAL_TABLET | Freq: Every day | ORAL | 2 refills | Status: DC
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
## 2023-09-22 ENCOUNTER — Ambulatory Visit (INDEPENDENT_AMBULATORY_CARE_PROVIDER_SITE_OTHER): Payer: Medicaid Other | Admitting: Licensed Clinical Social Worker

## 2023-09-22 DIAGNOSIS — F4310 Post-traumatic stress disorder, unspecified: Secondary | ICD-10-CM

## 2023-09-22 DIAGNOSIS — F330 Major depressive disorder, recurrent, mild: Secondary | ICD-10-CM | POA: Diagnosis not present

## 2023-09-22 DIAGNOSIS — F411 Generalized anxiety disorder: Secondary | ICD-10-CM
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
## 2023-09-30 IMAGING — US US MFM OB FOLLOW-UP EACH ADDL GEST (MODIFY)
1 series · 16 of 28 positions shown · non-contrast
Comparison: none

[Series 1: us mfm ob follow-up each addl gest (modify) · 60 acquisitions, 16 frames shown]
[im 1/60]
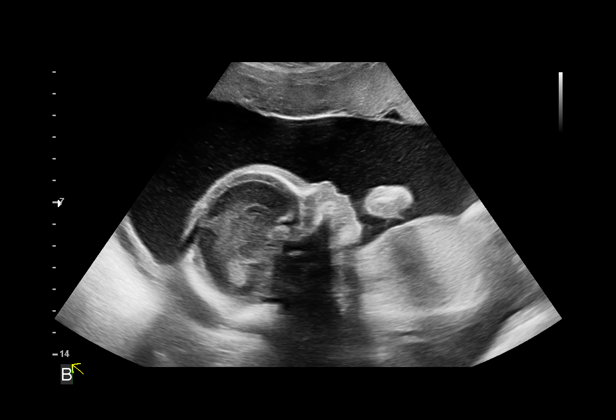
[im 5/60]
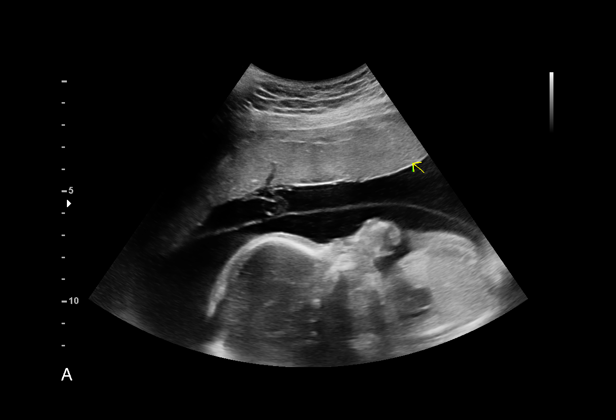
[im 9/60]
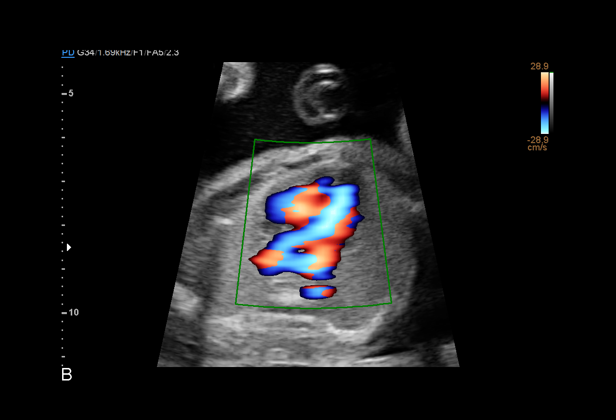
[im 14/60]
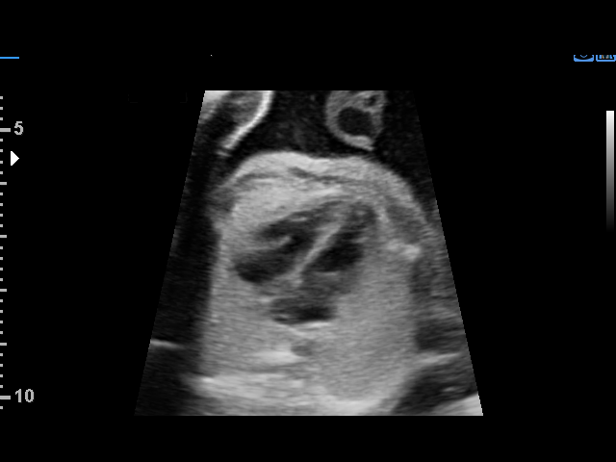
[im 16/60]
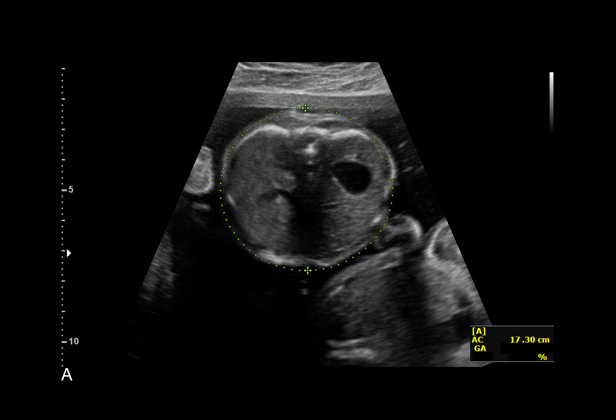
[im 20/60]
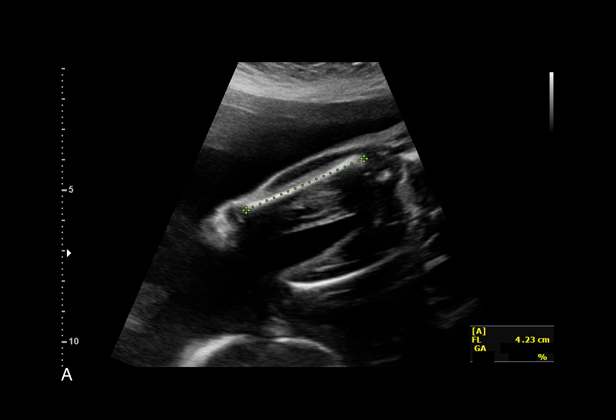
[im 25/60]
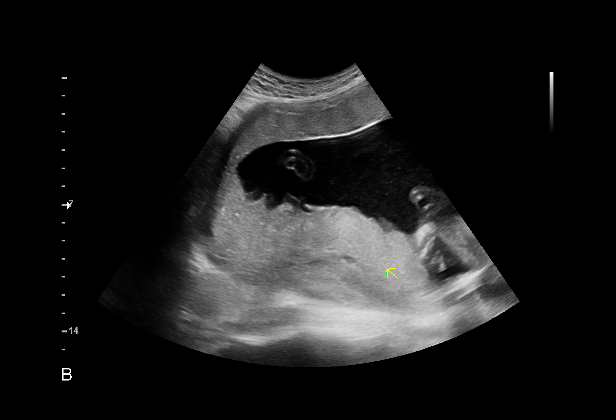
[im 29/60]
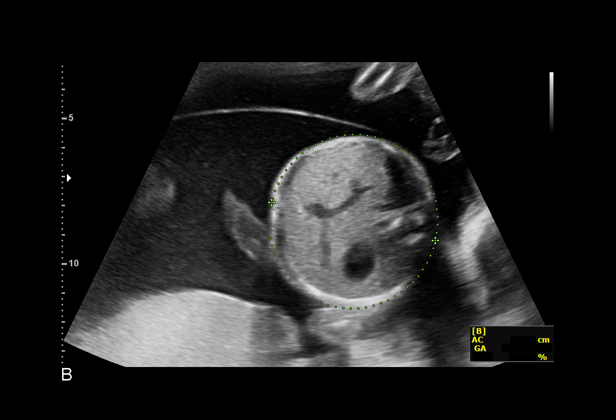
[im 31/60]
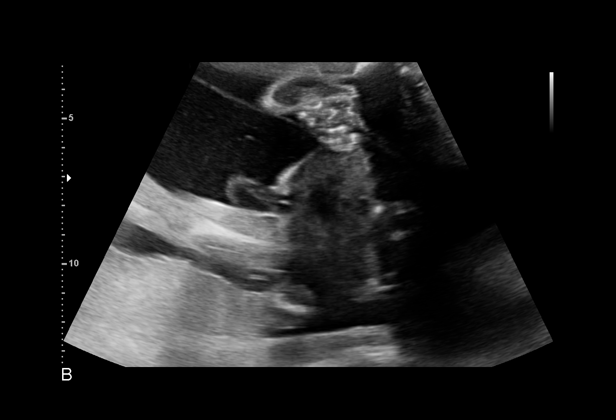
[im 35/60]
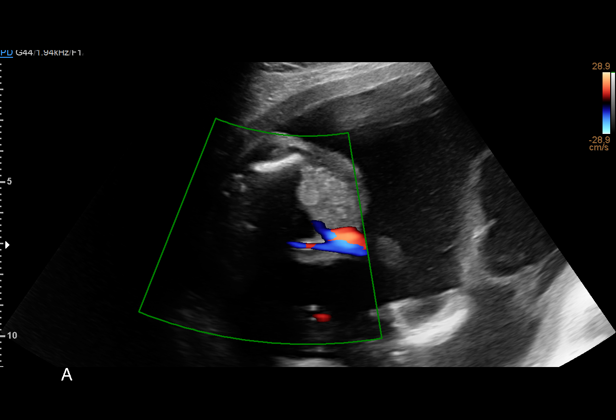
[im 40/60]
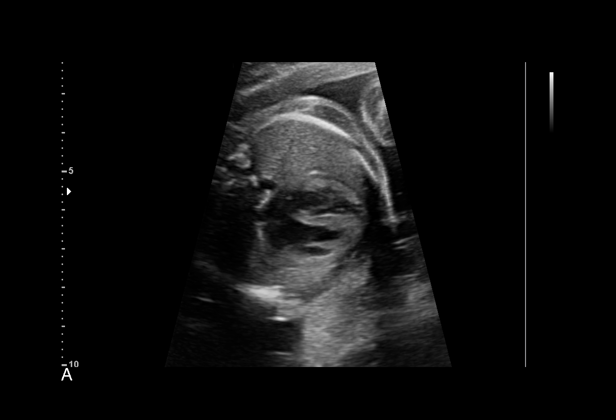
[im 44/60]
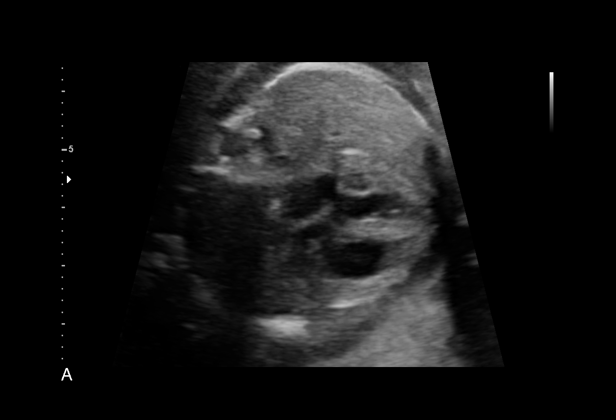
[im 46/60]
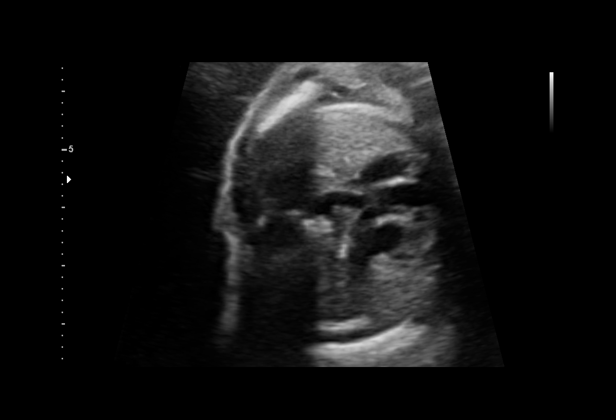
[im 51/60]
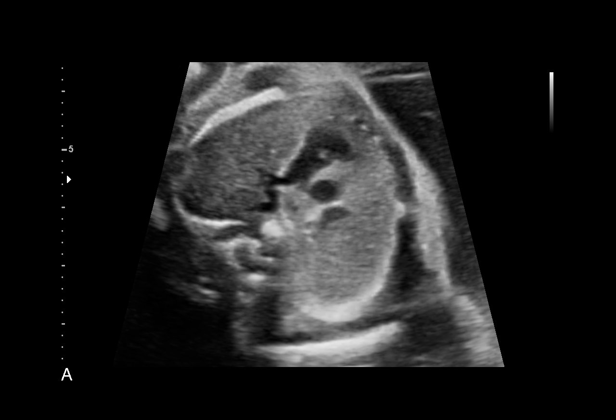
[im 55/60]
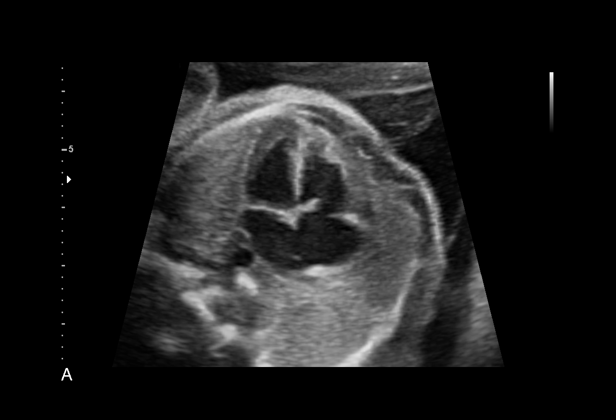
[im 60/60]
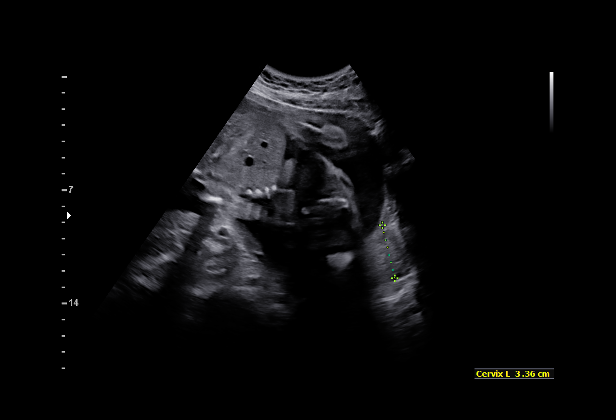

[16 of 28 positions shown; findings below may reference images not displayed]

Attending:        Deeqa Rayaan Adlaho        Secondary Phy.:   [id number]
                   [id number]

    GEST

Indications

 23 weeks gestation of pregnancy
 Twin pregnancy, di/di, second trimester
 LR NIPS
 Genetic carrier (silent carrier for alpha
 thalassemia)
Fetal Evaluation (Fetus A)

 Num Of Fetuses:         2
 Fetal Heart Rate(bpm):  147
 Cardiac Activity:       Observed
 Fetal Lie:              Lower Fetus
 Presentation:           Cephalic
 Placenta:               Anterior
 P. Cord Insertion:      Previously Visualized
 Membrane Desc:      Dividing Membrane seen - Dichorionic.

 Amniotic Fluid
 AFI FV:      Within normal limits

                             Largest Pocket(cm)

Biometry (Fetus A)

 BPD:      59.2  mm     G. Age:  24w 1d         81  %    CI:        73.73   %    70 - 86
                                                         FL/HC:      19.1   %    19.2 -
 HC:       219   mm     G. Age:  24w 0d         67  %    HC/AC:      1.25        1.05 -
 AC:       175   mm     G. Age:  22w 3d         20  %    FL/BPD:     70.8   %    71 - 87
 FL:       41.9  mm     G. Age:  23w 4d         55  %    FL/AC:      23.9   %    20 - 24

 Est. FW:     564  gm      1 lb 4 oz     41  %     FW Discordancy         1  %
OB History

 Gravidity:    1         Term:   0        Prem:   0        SAB:   0
 TOP:          0       Ectopic:  0        Living: 0
Gestational Age (Fetus A)

 U/S Today:     23w 4d                                        EDD:   04/02/22
 Best:          23w 1d     Det. By:  Previous Ultrasound      EDD:   04/05/22
                                     (09/03/21)
Anatomy (Fetus A)

 Cranium:               Appears normal         Aortic Arch:            Previously seen
 Cavum:                 Previously seen        Ductal Arch:            Previously seen
 Ventricles:            Appears normal         Diaphragm:              Previously seen
 Choroid Plexus:        Previously seen        Stomach:                Appears normal, left
                                                                       sided
 Cerebellum:            Previously seen        Abdomen:                Previously seen
 Posterior Fossa:       Previously seen        Abdominal Wall:         Previously seen
 Nuchal Fold:           Previously seen        Cord Vessels:           Appears normal (3
                                                                       vessel cord)
 Face:                  Orbits and profile     Kidneys:                Appear normal
                        previously seen
 Lips:                  Previously seen        Bladder:                Appears normal
 Thoracic:              Previously seen        Spine:                  Previously seen
 Heart:                 Appears normal         Upper Extremities:      Previously seen
                        (4CH, axis, and
                        situs)
 RVOT:                  Appears normal         Lower Extremities:      Previously seen
 LVOT:                  Appears normal

 Other:  Nasal bone, lenses, maxilla, mandible. heels/feet and open hands/5th
         digits previously visualized. Fetus appears to be female. VC, 3VV and
         3VTV visualized.

Fetal Evaluation (Fetus B)

 Num Of Fetuses:         2
 Fetal Heart Rate(bpm):  152
 Cardiac Activity:       Observed
 Fetal Lie:              Upper Fetus
 Presentation:           Breech
 Placenta:               Posterior
 P. Cord Insertion:      Previously Visualized
 Membrane Desc:      Dividing Membrane seen - Dichorionic.
 Amniotic Fluid
 AFI FV:      Within normal limits

                             Largest Pocket(cm)

Biometry (Fetus B)

 BPD:      54.9  mm     G. Age:  22w 5d         29  %    CI:        70.13   %    70 - 86
                                                         FL/HC:      20.2   %    19.2 -
 HC:      209.1  mm     G. Age:  23w 0d         29  %    HC/AC:      1.17        1.05 -
 AC:      179.2  mm     G. Age:  22w 6d         30  %    FL/BPD:     76.9   %    71 - 87
 FL:       42.2  mm     G. Age:  23w 5d         59  %    FL/AC:      23.5   %    20 - 24
 LV:        5.3  mm

 Est. FW:     570  gm      1 lb 4 oz     44  %     FW Discordancy      0 \ 1 %
Gestational Age (Fetus B)

 U/S Today:     23w 1d                                        EDD:   04/05/22
 Best:          23w 1d     Det. By:  Previous Ultrasound      EDD:   04/05/22
                                     (09/03/21)
Anatomy (Fetus B)

 Cranium:               Appears normal         Aortic Arch:            Previously seen
 Cavum:                 Previously seen        Ductal Arch:            Previously seen
 Ventricles:            Appears normal         Diaphragm:              Previously seen
 Choroid Plexus:        Previously seen        Stomach:                Appears normal, left
                                                                       sided
 Cerebellum:            Previously seen        Abdomen:                Previously seen
 Posterior Fossa:       Previously seen        Abdominal Wall:         Previously seen
 Nuchal Fold:           Previously seen        Cord Vessels:           Previously seen
 Face:                  Orbits and profile     Kidneys:                Appear normal
                        previously seen
 Lips:                  Previously seen        Bladder:                Appears normal
 Thoracic:              Previously seen        Spine:                  Previously seen
 Heart:                 Not well visualized    Upper Extremities:      Previously seen
 RVOT:                  Not well visualized    Lower Extremities:      Previously seen
 LVOT:                  Previously seen

 Other:  Nasal bone, lenses, maxilla, mandible, heels/feet and open hands/5th
         digits visualized. Fetus appears to be female. VC, 3VV and 3VTV not
         well visualized.
Cervix Uterus Adnexa

 Cervix
 Normal appearance by transabdominal scan.

 Right Ovary
 Not visualized.

 Left Ovary
 Not visualized.
Impression
 Fetal echo [REDACTED]Dichorionic Diamniotic Twin Pregnancy.
 Monozygotic Twins on Cell-Free Fetal DNA Screening.
 Patient Returned for Completion of Fetal Anatomy.
 Twin A: Lower Fetus, Cephalic Presentation, Anterior
 Placenta.  Fetal Growth Is Appropriate for Gestational Age.
 Cardiac Anatomy Appears Normal.  Amniotic Fluid Is Normal
 and Good Fetal Activity Seen.
 Twin B: Upper fetus, breech presentation, posterior placenta.
 Amniotic fluid is normal and good fetal activity seen.  Fetal
 growth is appropriate for gestational age.  Cardiac anatomy
 could not be completed because of fetal position.
 Growth discordancy: 1% (normal).
 I reassured the patient of the findings.
 She has an appointment for fetal echocardiography on
 [DATE].
Recommendations

 -An appointment was made for her to return in 4 weeks for
 fetal growth assessment.
                Utada, Jiletu

## 2023-10-26 ENCOUNTER — Ambulatory Visit (INDEPENDENT_AMBULATORY_CARE_PROVIDER_SITE_OTHER): Payer: Medicaid Other | Admitting: Licensed Clinical Social Worker

## 2023-10-26 DIAGNOSIS — F330 Major depressive disorder, recurrent, mild: Secondary | ICD-10-CM | POA: Diagnosis not present

## 2023-10-26 DIAGNOSIS — F4310 Post-traumatic stress disorder, unspecified: Secondary | ICD-10-CM | POA: Diagnosis not present

## 2023-10-26 DIAGNOSIS — F411 Generalized anxiety disorder: Secondary | ICD-10-CM | POA: Diagnosis not present

## 2023-10-26 NOTE — Progress Notes (Addendum)
 THERAPIST PROGRESS NOTE   Session Time:  1:08pm - 2:00pm    Location: Patient: [facility] Provider: [facility]   Participation Level: Active    Behavioral Response: Alert, casually dressed, anxious mood/affect   Type of Therapy:  Individual Therapy   Treatment Goals addressed: Depression/anxiety management; Medication compliance; Maintaining healthier boundaries with network   Progress Towards Goals: Progressing   Interventions: CBT, supportive therapy, psychoeducation on healthy boundaries    Summary: Sylvia Walter is a 23 year old single AA female that presented for therapy appointment today with diagnoses of Generalized Anxiety Disorder; Major depressive disorder, recurrent, mild; and PTSD.   Suicidal/Homicidal: None; without intent or plan.     Therapist Response:  Clinician met with Sylvia Walter for an individual therapy appointment and assessed for safety, sobriety, and medication compliance.  Sylvia Walter presented for session 8 minutes late, but was alert, oriented x5, with no evidence or self-report of active SI/HI or A/V H.  Sylvia Walter reported that she continues taking medication as prescribed and denied abuse of alcohol or illicit substances.  Clinician inquired about Sylvia Walter's current emotional ratings today, as well as any significant changes in thoughts, feelings, or behavior since last check-in.  Sylvia Walter reported scores of 1/10 for depression, 5/10 for anxiety, and 0/10 for anger/irritability.  Sylvia Walter denied any panic attacks or outbursts.  Sylvia Walter reported that a recent struggle was going out with friends for valentines day, and encountering her ex boyfriend, who brought up distressing feelings, including fear, and anxiety.  Sylvia Walter stated "I can't stop thinking about it.  I feel like I'll see him everywhere".  Clinician provided psychoeducation on subject of boundaries with Sylvia Walter today using a handout.  This handout defined boundaries as the limits and rules that we  set for ourselves within relationships, and featured a breakdown of the 3 common categories of boundaries (i.e. porous, rigid, and healthy), along with typical traits specific to each one for easy identification.  It was noted that most people have a mixture of different boundary types depending on setting, person, and culture.  Clinician tasked Sylvia Walter with identifying which types of boundaries she presently has within her own support system, the collective impact these boundaries have upon her mental health, and changes that could be made in order to more effectively communicate her mental health needs.  Intervention was effective, as evidenced by Sylvia Walter actively engaging in discussion on subject, and reporting that she held very porous boundaries with her ex since they were young, which led to negative impact upon her mental and physical wellbeing.  Sylvia Walter reported that discussion led her to realize this person was very 'toxic' due to numerous red flag behaviors that were ignored, including his tendency to gaslight and manipulate her.  She reported that she plans to maintain rigid boundaries with this person from now on and be more careful about who she allows to get close to her, in addition to being more assertive and saying "No" when someone crosses a boundary she sets.  Clinician will continue to monitor.                     Plan: Follow up in 2 weeks.   Diagnosis: Generalized Anxiety Disorder; Major depressive disorder, recurrent, mild; and PTSD.  Collaboration of Care:   None required at this time.  Patient/Guardian was advised Release of Information must be obtained prior to any record release in order to collaborate their care with an outside provider. Patient/Guardian was advised if they have not already done so to contact the registration department to sign all necessary forms in order for [place] to release information regarding their care.     Consent: Patient/Guardian gives verbal consent for treatment and assignment of benefits for services provided during this visit. Patient/Guardian expressed understanding and agreed to proceed.   Noralee Stain, LCSW, LCAS 10/26/23

## 2023-11-10 ENCOUNTER — Ambulatory Visit (HOSPITAL_COMMUNITY): Payer: Medicaid Other | Admitting: Licensed Clinical Social Worker

## 2023-11-10 DIAGNOSIS — F330 Major depressive disorder, recurrent, mild: Secondary | ICD-10-CM | POA: Diagnosis not present

## 2023-11-10 DIAGNOSIS — F411 Generalized anxiety disorder: Secondary | ICD-10-CM | POA: Diagnosis not present

## 2023-11-10 DIAGNOSIS — F1010 Alcohol abuse, uncomplicated: Secondary | ICD-10-CM

## 2023-11-10 DIAGNOSIS — F4310 Post-traumatic stress disorder, unspecified: Secondary | ICD-10-CM | POA: Diagnosis not present

## 2023-11-10 NOTE — Progress Notes (Signed)
 Virtual Visit via Video Note  I connected with Sylvia Walter on 11/10/23 at 3:00pm by video enabled telemedicine application and verified that I am speaking with the correct person using two identifiers.   I discussed the limitations, risks, security and privacy concerns of performing an evaluation and management service by video and the availability of in person appointments. I also discussed with the patient that there may be a patient responsible charge related to this service. The patient expressed understanding and agreed to proceed.   I discussed the assessment and treatment plan with the patient. The patient was provided an opportunity to ask questions and all were answered. The patient agreed with the plan and demonstrated an understanding of the instructions.   The patient was advised to call back or seek an in-person evaluation if the symptoms worsen or if the condition fails to improve as anticipated.   I provided 1 hour of non-face-to-face time during this encounter.   Noralee Stain, LCSW, LCAS ________________________  THERAPIST PROGRESS NOTE   Session Time:  3:00pm - 4:00pm    Location: Patient: Patient Home Provider: Home Office   Participation Level: Active    Behavioral Response: Alert, casually dressed, anxious mood/affect   Type of Therapy:  Individual Therapy   Treatment Goals addressed: Depression/anxiety management; Medication compliance  Progress Towards Goals: Progressing   Interventions: CBT, psychoeducation on alcohol abuse, harm reduction   Summary: Sylvia Walter is a 23 year old single AA female that presented for therapy appointment today with diagnoses of Generalized Anxiety Disorder; Major depressive disorder, recurrent, mild; PTSD and Alcohol Use Disorder, Mild.   Suicidal/Homicidal: None; without intent or plan.     Therapist Response:  Clinician met with Sylvia Walter for a virtual therapy session and assessed for safety, sobriety, and medication  compliance.  Sylvia Walter presented for appointment on time and was alert, oriented x5, with no evidence or self-report of active SI/HI or A/V H.  Sylvia Walter reported that she continues taking medication as prescribed and denied abuse of illicit substances.  Clinician inquired about Sylvia Walter's emotional ratings today, as well as any significant changes in thoughts, feelings, or behavior since previous check-in.  Sylvia Walter reported scores of 0/10 for depression, 4/10 for anxiety, and 1/10 for anger/irritability.  Sylvia Walter denied any panic attacks or outbursts.  Sylvia Walter reported that a recent struggle has been noticing that she is drinking more often, stating "I'm drinking a lot, but trying to be safe about it".  Clinician utilized CAGE-AID and AUDIT screening tools with Sylvia Walter to gauge severity of drinking habit at present.  Sylvia Walter participated in screenings, with respective scores of 3 and 11.  Clinician provided psychoeducation on the subject of alcohol abuse to assist. This included defining what a standard drink is, and Sylvia Walter guidelines for responsible drinking versus abuse.  Clinician also explained the various consequences of alcohol abuse, including impact to mental and physical health, relationships, and performance at work and school.  Clinician reviewed criteria for alcohol use disorder with Sylvia Walter to determine whether she warrants diagnosis and treatment for condition at this time.  Clinician also discussed methods for harm reduction to assist Sylvia Walter with managing this habit more effectively.  Intervention was effective, as evidenced by Sylvia Walter actively engaging in discussion on subject, reporting that this helped her realize that she has been drinking to an unhealthy degree more frequently, and increased awareness into how this could negatively impact wellbeing over time.  Sylvia Walter reported that no one in her social circle has expressed concern,  and she has been able to manage her job and parenting  responsibilities, but over the weekend she did miss 2 quizzes for class due to drinking.  Sylvia Walter reported that there have been times when she has drank more than originally intended, wanted a drink so badly she couldn't think of anything else, and experienced withdrawal symptoms the morning after binge episodes.  She reported at present she is averaging at least 1-2 drinks in the evening, and substantially more (4+) on the weekend when going out with friends.  Sylvia Walter reported that she will plan to limit drinking to weekends with friends, and keep in mind the ratio for a standard drink to avoid abuse.  Sylvia Walter stated "After this weekend I'm not going to go out so much.  I need to keep an eye on this and know there are other things I can do to relieve stress".  Clinician added diagnosis of Alcohol Use Disorder, mild to Sylvia Walter's chart as a result of criteria being met, and will continue to monitor.  Clinician will continue to monitor.                     Plan: Follow up in 2 weeks.   Diagnosis: Generalized Anxiety Disorder; Major depressive disorder, recurrent, mild; PTSD and Alcohol Use Disorder, Mild.  Collaboration of Care:   None required at this time.                                                   Patient/Guardian was advised Release of Information must be obtained prior to any record release in order to collaborate their care with an outside provider. Patient/Guardian was advised if they have not already done so to contact the registration department to sign all necessary forms in order for Sylvia Walter to release information regarding their care.    Consent: Patient/Guardian gives verbal consent for treatment and assignment of benefits for services provided during this visit. Patient/Guardian expressed understanding and agreed to proceed.   Noralee Stain, LCSW, LCAS 11/10/23

## 2023-11-15 ENCOUNTER — Telehealth (HOSPITAL_BASED_OUTPATIENT_CLINIC_OR_DEPARTMENT_OTHER): Payer: Medicaid Other | Admitting: Family

## 2023-11-15 DIAGNOSIS — F4323 Adjustment disorder with mixed anxiety and depressed mood: Secondary | ICD-10-CM

## 2023-11-15 DIAGNOSIS — F4310 Post-traumatic stress disorder, unspecified: Secondary | ICD-10-CM

## 2023-11-15 MED ORDER — SERTRALINE HCL 50 MG PO TABS: 50.0000 mg | ORAL_TABLET | Freq: Every day | ORAL | 1 refills | Status: DC

## 2023-11-15 NOTE — Progress Notes (Signed)
 Virtual Visit via Video Note  I connected with Sylvia Walter on 11/15/23 at  3:30 PM EDT by a video enabled telemedicine application and verified that I am speaking with the correct person using two identifiers.  Location: Patient: Home Provider: Office   I discussed the limitations of evaluation and management by telemedicine and the availability of in person appointments. The patient expressed understanding and agreed to proceed.  I discussed the assessment and treatment plan with the patient. The patient was provided an opportunity to ask questions and all were answered. The patient agreed with the plan and demonstrated an understanding of the instructions.   The patient was advised to call back or seek an in-person evaluation if the symptoms worsen or if the condition fails to improve as anticipated.  I provided 15 minutes of non-face-to-face time during this encounter.   Sylvia Rack, NP   [facility] MD/PA/NP OP Progress Note  11/15/2023 3:50 PM Sylvia Walter  MRN:  [id number]  Chief Complaint: Medication management follow-up appointment  HPI: Sylvia Walter 23 year old African-American female presents due to medication management follow-up appointment.  Per previous visit patient was to increase Zoloft 25 mg to 50 mg daily.  States she has been taking and tolerating medications well.  States overall her mood has improved she is feeling better and is becoming actively social.  Patient reports some reluctance with increase in medication however, states she can tell a difference in her mood when she is not on medications.  Denied any mania symptoms i.e. racing thoughts, hyperverbal or pressured speech.  She reports she continues to breast-feed as we had discussed initiating a mood stabilization medication.  Will follow-up with confirmed bipolar diagnoses.  Reports she did noted an increase with alcohol intake " leading to out-of-control."  Reports she is drinking socially because she  is feeling better  Adjustment disorder with depression and anxiety: Continue Zoloft 50 mg daily Continue therapy services with Perlie Mayo -Follow-up 3 to 4 months for medication management follow-up appointment  She denied concerns related to suicidal or homicidal ideations.  Denies auditory or visual hallucinations.  Reports a good appetite.  States she is resting well throughout the night.  Encouraged to take medications nightly if she continues to feel overly sedated when taking medications.  States she currently takes her medications at noon.  She was receptive to plan.  No indication that patient is responding to internal or external stimuli.  Mood is congruent with affect.  Support, encouragement  and reassurance was provided   Visit Diagnosis:    ICD-10-CM   1. Adjustment disorder with mixed anxiety and depressed mood  F43.23     2. PTSD (post-traumatic stress disorder)  F43.10       Past Psychiatric History:   Past Medical History:  Past Medical History:  Diagnosis Date   ADHD    Alpha thalassemia silent carrier 10/25/2021    Past Surgical History:  Procedure Laterality Date   ROOT CANAL Left 07/31/2020   VAGINAL DELIVERY N/A 03/18/2022   Procedure: VAGINAL DELIVERY multiple gestation;  Surgeon: Tereso Newcomer, MD;  Location: [facility] LD ORS;  Service: Obstetrics;  Laterality: N/A;    Family Psychiatric History:   Family History:  Family History  Problem Relation Age of Onset   Hypertension Mother     Social History:  Social History   Socioeconomic History   Marital status: Single    Spouse name: Not on file   Number of children: Not on file  Years of education: Not on file   Highest education level: Not on file  Occupational History   Not on file  Tobacco Use   Smoking status: Never   Smokeless tobacco: Never  Vaping Use   Vaping status: Never Used  Substance and Sexual Activity   Alcohol use: Not Currently    Comment: occ, prior to pregnancy   Drug  use: Never   Sexual activity: Not Currently    Partners: Male    Birth control/protection: None  Other Topics Concern   Not on file  Social History Narrative   Not on file   Social Drivers of Health   Financial Resource Strain: Not on file  Food Insecurity: Not on file  Transportation Needs: Not on file  Physical Activity: Not on file  Stress: Not on file  Social Connections: Not on file    Allergies:  Allergies  Allergen Reactions   Other Other (See Comments)    Grass/ pecans    Metabolic Disorder Labs: Lab Results  Component Value Date   HGBA1C 5.8 (H) 04/20/2023   No results found for: "PROLACTIN" No results found for: "CHOL", "TRIG", "HDL", "CHOLHDL", "VLDL", "LDLCALC" Lab Results  Component Value Date   TSH 1.290 04/20/2023    Therapeutic Level Labs: No results found for: "LITHIUM" No results found for: "VALPROATE" No results found for: "CBMZ"  Current Medications: Current Outpatient Medications  Medication Sig Dispense Refill   ferrous sulfate 325 (65 FE) MG tablet Take 1 tablet (325 mg total) by mouth every other day. (Patient not taking: Reported on 05/06/2022) 30 tablet 3   Prenat-FeFum-DSS-FA-DHA w/o A (PNV-DHA+DOCUSATE) 27-1.25-300 MG CAPS Take 1 capsule by mouth daily before breakfast. (Patient not taking: Reported on 05/06/2022) 90 capsule 4   sertraline (ZOLOFT) 50 MG tablet Take 1 tablet (50 mg total) by mouth daily. 30 tablet 2   vitamin B-12 (CYANOCOBALAMIN) 500 MCG tablet Take 500 mcg by mouth daily. (Patient not taking: Reported on 04/20/2023)     No current facility-administered medications for this visit.     Musculoskeletal: Caregility virtual platform  Psychiatric Specialty Exam: Review of Systems  currently breastfeeding.There is no height or weight on file to calculate BMI.  General Appearance: Casual  Eye Contact:  Good  Speech:  Clear and Coherent  Volume:  Normal  Mood:  Anxious and Depressed  Affect:  Congruent  Thought  Process:  Coherent  Orientation:  Full (Time, Place, and Person)  Thought Content: Logical   Suicidal Thoughts:  No  Homicidal Thoughts:  No  Memory:  Immediate;   Good Recent;   Good  Judgement:  Good  Insight:  Good  Psychomotor Activity:  Normal  Concentration:  Concentration: Good  Recall:  Good  Fund of Knowledge: Good  Language: Good  Akathisia:  No  Handed:  Right  AIMS (if indicated): not done  Assets:  Communication Skills Desire for Improvement Resilience Social Support  ADL's:  Intact  Cognition: WNL  Sleep:  Good   Screenings: AUDIT    Advertising copywriter from 11/10/2023 in [facility]  Alcohol Use Disorder Identification Test Final Score (AUDIT) 11      CAGE-AID    Flowsheet Row Counselor from 11/10/2023 in [facility] at [place]  CAGE-AID Score 3      GAD-7    Flowsheet Row Counselor from 09/22/2023 in [facility] at [facility] Visit from 04/20/2023 in [facility] at [employer] Routine Prenatal from 01[facility]30/2022 in [facility] at [facility] Clinical Support from 09/29/2021 in [facility] for [facility] at [facility]  Total GAD-7 Score 20 11 0 3      PHQ2-9    Flowsheet Row Counselor from 09/22/2023 in [facility] at [facility] Visit from 09/13/2023 [facility] Visit from 04/20/2023 in [facility] at [place] Nutrition from 02/18/2022 in [facility]  - A Dept Of [place] Routine Prenatal from 01/30/2022 in [facility] for [facility] Healthcare at [facility] Total Score 2 3 2  0 0  PHQ-9 Total Score 19 18 9  -- 1      Flowsheet Row Counselor from 09/22/2023 in [facility] at [facility] Visit from 09/13/2023  in [facility] from 09/09/2023 in [facility] at [facility])  C-SSRS RISK CATEGORY No Risk No Risk No Risk        Assessment and Plan: Sylvia Walter 23 year old African-American female presents for medication management follow-up appointment.  Increased Zoloft 25 mg to 50 mg at previous appointment.  She reports she has been taking and tolerating medications well.  Does report feeling tired which she has started taking medications about noon.  Discussed taking medications nightly to help with side effects.  She was receptive to plan.  No concerns related to suicidal or homicidal ideations.  Reports her mood is stabilized since taking medications and has recently included talk therapy to her mental health regimen.  States she continues to monitor alcohol intake.  Support, encouragement and reassurance was provided  Adjustment disorder with depression and anxiety: Continue Zoloft 50 mg daily Continue therapy services with Perlie Mayo -Follow-up 3 to 4 months for medication management appointment  Collaboration of Care: Collaboration of Care: Medication Management AEB continue Zoloft 50 mg daily  Patient/Guardian was advised Release of Information must be obtained prior to any record release in order to collaborate their care with an outside provider. Patient/Guardian was advised if they have not already done so to contact the registration department to sign all necessary forms in order for [place] to release information regarding their care.   Consent: Patient/Guardian gives verbal consent for treatment and assignment of benefits for services provided during this visit. Patient/Guardian expressed understanding and agreed to proceed.    Sylvia Rack, NP 11/15/2023, 3:50 PM

## 2023-11-29 ENCOUNTER — Ambulatory Visit (INDEPENDENT_AMBULATORY_CARE_PROVIDER_SITE_OTHER): Payer: Medicaid Other | Admitting: Licensed Clinical Social Worker

## 2023-11-29 DIAGNOSIS — F1010 Alcohol abuse, uncomplicated: Secondary | ICD-10-CM | POA: Diagnosis not present

## 2023-11-29 DIAGNOSIS — F411 Generalized anxiety disorder: Secondary | ICD-10-CM | POA: Diagnosis not present

## 2023-11-29 DIAGNOSIS — F330 Major depressive disorder, recurrent, mild: Secondary | ICD-10-CM | POA: Diagnosis not present

## 2023-11-29 DIAGNOSIS — F4310 Post-traumatic stress disorder, unspecified: Secondary | ICD-10-CM | POA: Diagnosis not present

## 2023-11-29 NOTE — Progress Notes (Signed)
 Virtual Visit via Video Note  I connected with Sylvia Walter on 11/29/23 at 1:00pm by video enabled telemedicine application and verified that I am speaking with the correct person using two identifiers.   I discussed the limitations, risks, security and privacy concerns of performing an evaluation and management service by video and the availability of in person appointments. I also discussed with the patient that there may be a patient responsible charge related to this service. The patient expressed understanding and agreed to proceed.   I discussed the assessment and treatment plan with the patient. The patient was provided an opportunity to ask questions and all were answered. The patient agreed with the plan and demonstrated an understanding of the instructions.   The patient was advised to call back or seek an in-person evaluation if the symptoms worsen or if the condition fails to improve as anticipated.   I provided 1 hour of non-face-to-face time during this encounter.   Sylvia Stain, LCSW, LCAS ________________________   THERAPIST PROGRESS NOTE   Session Time:  1:00pm - 2:00pm    Location: Patient: Patient Home Provider: [facility]   Participation Level: Active    Behavioral Response: Alert, casually dressed, anxious mood/affect   Type of Therapy:  Individual Therapy   Treatment Goals addressed: Depression/anxiety management; Medication compliance  Progress Towards Goals: Progressing   Interventions: CBT: challenging anxious thoughts     Summary: Sylvia Walter is a 23 year old single AA female that presented for therapy appointment today with diagnoses of Generalized Anxiety Disorder; Major depressive disorder, recurrent, mild; PTSD and Alcohol Use Disorder, Mild.   Suicidal/Homicidal: None; without intent or plan.     Therapist Response:  Clinician met with Sylvia Walter for a virtual therapy appointment and assessed for safety, sobriety, and medication  compliance.  Sylvia Walter presented for session on time and was alert, oriented x5, with no evidence or self-report of active SI/HI or A/V H.  Sylvia Walter reported that she continues taking medication as prescribed and denied abuse of illicit substances.  She reported that she has been trying to limit alcohol intake, and drank 3 shots with friends last night, but had abstained for a week.  Clinician inquired about Sylvia Walter's current emotional ratings, as well as any significant changes in thoughts, feelings, or behavior since last check-in.  Sylvia Walter reported scores of 5/10 for depression, 7/10 for anxiety, and 2/10 for anger/irritability.  Sylvia Walter denied any panic attacks or outbursts.  Sylvia Walter reported that a recent struggle has been caring for her daughters, who both have RSV.  Sylvia Walter reported that she has also noticed that she has felt more angry and irritable lately, with ratings rising significantly over the weekend.  Clinician inquired about what might have triggered this, and any attempts she has made to cope.  Sylvia Walter reported that she has had family members expressing concern that she is not spending enough time being a present mother, and 'pawning' the children off on other family members.  Clinician utilized handout in session today titled "Worry exploration" in order to assist Sylvia Walter in reducing her anxiety related to level of involvement as a mother.  This worksheet featured a series of Socratic questions aimed at exploring the most likely outcomes for a situation of concern, rather than focusing on the worst possible outcome (i.e. catastrophizing).  Clinician assisted Sylvia Walter in identifying and challenging any irrational beliefs related to this worry, in addition to utilizing problem solving approach to explore strategies which would help her accomplish goal of  ensuring that she is meeting the demands of being a present, responsible mother.  Sylvia Walter actively participated in discussion on handout,  reporting that there is sufficient evidence to suggest she is being a good mother, as she chose to change her therapy session to virtual today in order to be available while the kids are sick, in addition to ensuring that they have food, shelter, love, healthcare, and other basic needs met.  Intervention was effective, as evidenced by [facility] reporting that discussion on this subject reduced her anxiety down to 3/10, and increased her confidence in her ability to continue providing proper care to her daughters.  Sylvia Walter stated "I don't know why I've been letting this ruminate so much.  Theres nothing they can say that I'm not providing.  I am a a good mom".  Clinician will continue to monitor.                     Plan: Follow up in 2 weeks.   Diagnosis: Generalized Anxiety Disorder; Major depressive disorder, recurrent, mild; PTSD and Alcohol Use Disorder, Mild.  Collaboration of Care:   None required at this time.                                                   Patient/Guardian was advised Release of Information must be obtained prior to any record release in order to collaborate their care with an outside provider. Patient/Guardian was advised if they have not already done so to contact the registration department to sign all necessary forms in order for [place] to release information regarding their care.    Consent: Patient/Guardian gives verbal consent for treatment and assignment of benefits for services provided during this visit. Patient/Guardian expressed understanding and agreed to proceed.   Sylvia Walter, [place], LCAS 11/29/23

## 2023-12-02 ENCOUNTER — Encounter (HOSPITAL_COMMUNITY): Payer: Self-pay | Admitting: Emergency Medicine

## 2023-12-02 ENCOUNTER — Other Ambulatory Visit: Payer: Self-pay

## 2023-12-02 ENCOUNTER — Emergency Department (HOSPITAL_COMMUNITY)
Admission: EM | Admit: 2023-12-02 | Discharge: 2023-12-02 | Disposition: A | Discharge status: Home / Self Care | Attending: Emergency Medicine | Admitting: Emergency Medicine

## 2023-12-02 DIAGNOSIS — N6322 Unspecified lump in the left breast, upper inner quadrant: Secondary | ICD-10-CM | POA: Diagnosis present

## 2023-12-02 NOTE — Discharge Instructions (Signed)
 Thank you for coming to [facility] Emergency Department. You were seen for a small lump in the upper inner quadrant of your left breast. Please follow up with your OB/Gyn or PCP within 1-2 weeks to discuss breast ultrasound vs mammogram and genetic testing.    Do not hesitate to return to the ED or call 911 if you experience: -Worsening symptoms -Lightheadedness, passing out -Fevers/chills -Anything else that concerns you

## 2023-12-02 NOTE — ED Provider Notes (Signed)
 [place] EMERGENCY DEPARTMENT AT [facility] Provider Note   CSN: [id number] Arrival date & time: 12/02/23  1638     History {Add pertinent medical, surgical, social history, OB history to HPI:1} Chief Complaint  Patient presents with   Wound Check    Sylvia Walter is a 23 y.o. female with PMH as listed below who presents w/ "lump" on left side of chest. Denies any injury or trauma. Recent sick contacts, children tested positive for RSV. Denies any pain.   Past Medical History:  Diagnosis Date   ADHD    Alpha thalassemia silent carrier 10/25/2021       Home Medications Prior to Admission medications   Medication Sig Start Date End Date Taking? Authorizing Provider  ferrous sulfate 325 (65 FE) MG tablet Take 1 tablet (325 mg total) by mouth every other day. Patient not taking: Reported on 05/06/2022 03/20/22 03/20/23  Warner Mccreedy, MD  Prenat-FeFum-DSS-FA-DHA w/o A (PNV-DHA+DOCUSATE) 27-1.25-300 MG CAPS Take 1 capsule by mouth daily before breakfast. Patient not taking: Reported on 05/06/2022 12/24/21   Brock Bad, MD  sertraline (ZOLOFT) 50 MG tablet Take 1 tablet (50 mg total) by mouth daily. 11/15/23 11/14/24  [name], NP  vitamin B-12 (CYANOCOBALAMIN) 500 MCG tablet Take 500 mcg by mouth daily. Patient not taking: Reported on 04/20/2023    [provider]      Allergies    Other    Review of Systems   Review of Systems A 10 point review of systems was performed and is negative unless otherwise reported in HPI.  Physical Exam Updated Vital Signs BP 126/76   Pulse 80   Temp 98.5 F (36.9 C)   Resp 18   SpO2 100%  Physical Exam General: Normal appearing {Desc; female/female:11659}, lying in bed.  HEENT: PERRLA, Sclera anicteric, MMM, trachea midline.  Cardiology: RRR, no murmurs/rubs/gallops. BL radial and DP pulses equal bilaterally.  Resp: Normal respiratory rate and effort. CTAB, no wheezes, rhonchi, crackles.  Abd: Soft,  non-tender, non-distended. No rebound tenderness or guarding.  GU: Deferred. MSK: No peripheral edema or signs of trauma. Extremities without deformity or TTP. No cyanosis or clubbing. Skin: warm, dry. No rashes or lesions. Back: No CVA tenderness Neuro: A&Ox4, CNs II-XII grossly intact. MAEs. Sensation grossly intact.  Psych: Normal mood and affect.   ED Results / Procedures / Treatments   Labs (all labs ordered are listed, but only abnormal results are displayed) Labs Reviewed - No data to display  EKG None  Radiology No results found.  Procedures Procedures  {Document cardiac monitor, telemetry assessment procedure when appropriate:1}  Medications Ordered in ED Medications - No data to display  ED Course/ Medical Decision Making/ A&P                          Medical Decision Making   This patient presents to the ED for concern of ***, this involves an extensive number of treatment options, and is a complaint that carries with it a high risk of complications and morbidity.  I considered the following differential and admission for this acute, potentially life threatening condition.   MDM:    ***     Labs: I Ordered, and personally interpreted labs.  The pertinent results include:  ***  Imaging Studies ordered: I ordered imaging studies including *** I independently visualized and interpreted imaging. I agree with the radiologist interpretation  Additional history obtained from ***.  External records from outside source obtained and reviewed including ***  Cardiac Monitoring: The patient was maintained on a cardiac monitor.  I personally viewed and interpreted the cardiac monitored which showed an underlying rhythm of: ***  Reevaluation: After the interventions noted above, I reevaluated the patient and found that they have :{resolved/improved/worsened:23923::"improved"}  Social Determinants of Health: ***  Disposition:  ***  Co morbidities that  complicate the patient evaluation  Past Medical History:  Diagnosis Date   ADHD    Alpha thalassemia silent carrier 10/25/2021     Medicines No orders of the defined types were placed in this encounter.   I have reviewed the patients home medicines and have made adjustments as needed  Problem List / ED Course: Problem List Items Addressed This Visit   None        {Document critical care time when appropriate:1} {Document review of labs and clinical decision tools ie heart score, Chads2Vasc2 etc:1}  {Document your independent review of radiology images, and any outside records:1} {Document your discussion with family members, caretakers, and with consultants:1} {Document social determinants of health affecting pt's care:1} {Document your decision making why or why not admission, treatments were needed:1}  This note was created using dictation software, which may contain spelling or grammatical errors.

## 2023-12-02 NOTE — ED Triage Notes (Signed)
 Pt reports "lump" on left side of chest. Denies any injury or trauma. Recent sick contacts, children tested positive for RSV. Denies any pain.

## 2023-12-06 ENCOUNTER — Other Ambulatory Visit: Payer: Self-pay

## 2023-12-06 ENCOUNTER — Ambulatory Visit: Admitting: Obstetrics and Gynecology

## 2023-12-06 ENCOUNTER — Encounter: Admitting: Genetic Counselor

## 2023-12-06 ENCOUNTER — Telehealth: Payer: Self-pay | Admitting: Genetic Counselor

## 2023-12-06 ENCOUNTER — Encounter: Payer: Self-pay | Admitting: Genetic Counselor

## 2023-12-06 VITALS — BP 114/77 | HR 80 | Wt 194.0 lb

## 2023-12-06 DIAGNOSIS — N649 Disorder of breast, unspecified: Secondary | ICD-10-CM | POA: Diagnosis not present

## 2023-12-06 NOTE — Telephone Encounter (Signed)
 Patient called to set up genetics appt due to FH BRCA2 mutation in mother (tested at [facility]).  Scheduled 4/3 at 10am.

## 2023-12-06 NOTE — Progress Notes (Signed)
 23 yo P2 presenting as an ED follow up for evaluation of left breast mass. Patient reports noticing this mass 3 weeks ago. She is still breast feeding. She reports return of her menses since February. She is not currently sexually active and is not using any birth control. She states the mass is not tender and has increased slightly in size over the past 3 weeks. She is without any complaints. Patient with strong family history of breast cancer  Past Medical History:  Diagnosis Date   ADHD    Alpha thalassemia silent carrier 10/25/2021   Past Surgical History:  Procedure Laterality Date   ROOT CANAL Left 07/31/2020   VAGINAL DELIVERY N/A 03/18/2022   Procedure: VAGINAL DELIVERY multiple gestation;  Surgeon: Tereso Newcomer, MD;  Location: [facility] LD ORS;  Service: Obstetrics;  Laterality: N/A;   Family History  Problem Relation Age of Onset   Hypertension Mother    BRCA 1/2 Mother        BRCA2 positive   Social History   Tobacco Use   Smoking status: Never   Smokeless tobacco: Never  Vaping Use   Vaping status: Never Used  Substance Use Topics   Alcohol use: Not Currently    Comment: occ, prior to pregnancy   Drug use: Never   ROS See pertinent in HPI. All other systems reviewed and non contributory Blood pressure 114/77, pulse 80, weight 194 lb (88 kg), currently breastfeeding. GENERAL: Well-developed, well-nourished female in no acute distress.  BREASTS: Symmetric in size. Palpable mass at 12 o'clock in left breast 3 cm below the clavicle, measuring 2 x 3 cm, non mobile, non tender. No palpable lymphadenopathy, skin changes, or nipple drainage. EXTREMITIES: No cyanosis, clubbing, or edema, 2+ distal pulses.  A/P 23 yo with left breast mass - Patient referred for left breast ultrasound - Patient is current on pap smear - RTC prn

## 2023-12-06 NOTE — Progress Notes (Signed)
 Pt is in office for follow up ED visit.  Pt has area of concern in left breast x couple weeks. Pt denies pain/tenderness. Pt is currently breastfeeding 2-5 times daily.

## 2023-12-07 ENCOUNTER — Other Ambulatory Visit: Payer: Self-pay | Admitting: Obstetrics and Gynecology

## 2023-12-07 ENCOUNTER — Ambulatory Visit
Admission: RE | Admit: 2023-12-07 | Discharge: 2023-12-07 | Disposition: A | Discharge status: Home / Self Care | Source: Ambulatory Visit | Attending: Obstetrics and Gynecology | Admitting: Obstetrics and Gynecology

## 2023-12-07 DIAGNOSIS — N649 Disorder of breast, unspecified: Secondary | ICD-10-CM

## 2023-12-07 DIAGNOSIS — N6320 Unspecified lump in the left breast, unspecified quadrant: Secondary | ICD-10-CM

## 2023-12-08 ENCOUNTER — Other Ambulatory Visit: Payer: Self-pay | Admitting: Obstetrics and Gynecology

## 2023-12-08 ENCOUNTER — Ambulatory Visit
Admission: RE | Admit: 2023-12-08 | Discharge: 2023-12-08 | Disposition: A | Discharge status: Home / Self Care | Source: Ambulatory Visit | Attending: Obstetrics and Gynecology | Admitting: Obstetrics and Gynecology

## 2023-12-08 DIAGNOSIS — N6320 Unspecified lump in the left breast, unspecified quadrant: Secondary | ICD-10-CM

## 2023-12-08 DIAGNOSIS — N649 Disorder of breast, unspecified: Secondary | ICD-10-CM

## 2023-12-08 HISTORY — PX: BREAST BIOPSY: SHX20

## 2023-12-09 ENCOUNTER — Inpatient Hospital Stay: Attending: Nurse Practitioner | Admitting: Genetic Counselor

## 2023-12-09 ENCOUNTER — Encounter: Payer: Self-pay | Admitting: Genetic Counselor

## 2023-12-09 ENCOUNTER — Inpatient Hospital Stay

## 2023-12-09 DIAGNOSIS — Z803 Family history of malignant neoplasm of breast: Secondary | ICD-10-CM

## 2023-12-09 DIAGNOSIS — Z8041 Family history of malignant neoplasm of ovary: Secondary | ICD-10-CM

## 2023-12-09 DIAGNOSIS — Z1379 Encounter for other screening for genetic and chromosomal anomalies: Secondary | ICD-10-CM | POA: Diagnosis not present

## 2023-12-09 DIAGNOSIS — Z8481 Family history of carrier of genetic disease: Secondary | ICD-10-CM | POA: Diagnosis not present

## 2023-12-09 DIAGNOSIS — Z800 Family history of malignant neoplasm of digestive organs: Secondary | ICD-10-CM

## 2023-12-09 LAB — GENETIC SCREENING ORDER

## 2023-12-09 LAB — SURGICAL PATHOLOGY

## 2023-12-09 NOTE — Progress Notes (Signed)
 REFERRING PROVIDER: No referring provider defined for this encounter.  PRIMARY PROVIDER:  None listed  PRIMARY REASON FOR VISIT:  1. Family history of gene mutation   2. Family history of breast cancer   3. Family history of pancreatic cancer   4. Family history of ovarian cancer      HISTORY OF PRESENT ILLNESS:   Sylvia Walter, a 23 y.o. female, was seen for a [place] cancer genetics consultation due to a family history of a BRCA2 gene mutation in her mother.  Sylvia Walter presents to clinic today to discuss the possibility of a hereditary predisposition to cancer, to discuss genetic testing, and to further clarify her future cancer risks, as well as potential cancer risks for family members.   Sylvia Walter is a 23 y.o. female with no personal history of cancer.  She recently had a breast ultrasound due to a palpable mass.  Biopsy is pending.    CANCER HISTORY:  Oncology History   No history exists.     SCREENING/RISK FACTORS:  Number of breast biopsies: 1; see history noted above Colonoscopy: no; not examined. Hysterectomy: no.  Ovaries intact: yes.  Menarche was at age 78 or 44.  First live birth at age 35.  Menopausal status: premenopausal.  OCP use for less than 1 year.    Past Medical History:  Diagnosis Date   ADHD    Alpha thalassemia silent carrier 10/25/2021    Past Surgical History:  Procedure Laterality Date   BREAST BIOPSY Left 12/08/2023   [place] LT BREAST BX W LOC DEV 1ST LESION IMG BX SPEC US GUIDE 12/08/2023 [facility] MAMMOGRAPHY   ROOT CANAL Left 07/31/2020   VAGINAL DELIVERY N/A 03/18/2022   Procedure: VAGINAL DELIVERY multiple gestation;  Surgeon: Tereso Newcomer, MD;  Location: [facility] LD ORS;  Service: Obstetrics;  Laterality: N/A;     FAMILY HISTORY:  We obtained a detailed, 4-generation family history.  Significant diagnoses are listed below: Family History  Problem Relation Age of Onset   Hypertension Mother    BRCA 1/2 Mother        BRCA2 positive    BRCA 1/2 Maternal Aunt        BRCA2 positive   Prostate cancer Maternal Grandfather 60   Cancer Other        breast cancer in MGM's two sisters (~ age 75); pancreatic cancer in MGM's mother (10s); myeloma in MGM's father   Prostate cancer Other        MGF's brother and father (mets); dx 19s-60s   Lung cancer Other        MGF's brother; d. 77   Ovarian cancer Other        PGM's sister     Sylvia Walter's mother had genetic testing for the [facility] CancerNext-Expanded +RNAInsight Panel, which showed a single pathogenic variant in BRCA2 called [id number] ([id number]).  She also has a VUS in BRCA2 called c.449A>G (p.H150R). The CancerNext-Expanded gene panel offered by [facility] and includes sequencing, rearrangement, and RNA analysis for the following 76 genes: AIP, ALK, APC, ATM, AXIN2, BAP1, BARD1, BMPR1A, BRCA1, BRCA2, BRIP1, CDC73, CDH1, CDK4, CDKN1B, CDKN2A, CEBPA, CHEK2, CTNNA1, DDX41, DICER1, ETV6, FH, FLCN, GATA2, LZTR1, MAX, MBD4, MEN1, MET, MLH1, MSH2, MSH3, MSH6, MUTYH, NF1, NF2, NTHL1, PALB2, PHOX2B, PMS2, POT1, PRKAR1A, PTCH1, PTEN, RAD51C, RAD51D, RB1, RET, RUNX1, SDHA, SDHAF2, SDHB, SDHC, SDHD, SMAD4, SMARCA4, SMARCB1, SMARCE1, STK11, SUFU, TMEM127, TP53, TSC1, TSC2, VHL, and WT1 (sequencing and deletion/duplication); EGFR, HOXB13, KIT, MITF, PDGFRA, POLD1,  and POLE (sequencing only); EPCAM and GREM1 (deletion/duplication only).   Sylvia Walter's maternal aunt was shown to have the same BRCA2 mutation as her mother through an [employer] 64 gene panel.    Sylvia Walter maternal grandmother had genetic testing in 2011 through [facility], which was negative with a VUS in BRCA2 called H150R.  Her maternal grandmother's genetic testing included BRCA1/2 sequencing and BRCA1 5-site rearrangement panel only.   She is unaware of any other family history of hereditary cancer genetic testing.   There is no reported Ashkenazi Jewish ancestry. There is no known consanguinity.  GENETIC COUNSELING  ASSESSMENT: Sylvia Walter is a 23 y.o. female with a family history of BRCA2 gene mutation. We, therefore, discussed and recommended the following at today's visit.   DISCUSSION: We discussed that 5 - 10% of cancer is hereditary, with most cases of hereditary hereditary breast cancer associated with mutations in BRCA1/2.  Given her mother has a mutation in the BRCA2 gene, she has a 50% chance of having the same family mutation in BRCA2.  We reviewed the breast, ovarian, pancreatic, prostate, and melanoma cancer risks and management strategies associated with BRCA2 mutations. We discussed that testing is beneficial for several reasons, including knowing about other cancer risks, identifying potential screening and risk-reduction options that may be appropriate, and understanding if other family members could be at an increased risk for cancer and allowing them to undergo genetic testing.  We reviewed the characteristics, features and inheritance patterns of hereditary cancer syndromes. We also discussed genetic testing, including the appropriate family members to test, the process of testing, insurance coverage and turn-around-time for results. We discussed the implications of a negative, positive, and variant of uncertain significant result. We recommended Sylvia Walter pursue genetic testing for BRCA2.  We also discussed that a more comprehensive panel is reasonable, given her paternal grandmother's sister had ovarian cancer and she has limited information about some paternal relatives.  Sylvia Walter was interested in proceeding in a comprehensive panel.   Sylvia Walter was offered a common hereditary cancer panel (~40 genes) and an expanded pan-cancer panel (~70 genes). Sylvia Walter was informed of the benefits and limitations of each panel, including that expanded pan-cancer panels contain several genes that do not have clear management guidelines at this point in time.  We also discussed that as the number of genes included  on a panel increases, the chances of variants of uncertain significance increases.  After considering the benefits and limitations of each gene panel, Sylvia Walter elected to have a common hereditary cancers panel through [facility].  The [facility] CancerNext+RNAinsight Panel includes sequencing, rearrangement analysis, and RNA analysis for the following 39 genes: APC, ATM, BAP1, BARD1, BMPR1A, BRCA1, BRCA2, BRIP1, CDH1, CDKN2A, CHEK2, FH, FLCN, MET, MLH1, MSH2, MSH6, MUTYH, NF1, NTHL1, PALB2, PMS2, PTEN, RAD51C, RAD51D, SMAD4, STK11, TP53, TSC1, TSC2, and VHL (sequencing and deletion/duplication); AXIN2, HOXB13, MBD4, MSH3, POLD1 and POLE (sequencing only); EPCAM and GREM1 (deletion/duplication only).   Based on Sylvia Walter's family history of a BRCA2 gene mutation in her mother, she meets NCCN criteria for genetic testing.  We discussed that if her out of pocket cost for testing is over $100, the laboratory should contact them to discuss self-pay options and/or patient pay assistance programs.   We discussed the Genetic Information Non-Discrimination Act (GINA) of 2008, which helps protect individuals against genetic discrimination based on their genetic test results.  It impacts both health insurance and employment.  With health insurance, it protects against  genetic test results being used for increased premiums or policy termination. For employment, it protects against hiring, firing and promoting decisions based on genetic test results.  GINA does not apply to those in the [employer], those who work for companies with less than 15 employees, and new life insurance or long-term disability insurance policies.  Health status due to a cancer diagnosis is not protected under GINA.  PLAN: After considering the risks, benefits, and limitations, Sylvia Walter provided informed consent to pursue genetic testing and the blood sample was sent to [facility] for analysis of the CancerNext+RNAinsight Panel. Results  should be available within approximately 2-3 weeks' time, at which point they will be disclosed by telephone to Sylvia Walter, as will any additional recommendations warranted by these results. Sylvia Walter will receive a summary of her genetic counseling visit and a copy of her results once available. This information will also be available in Epic.   Based on Ms. Deshazo's family history, we recommended her maternal grandfather have genetic counseling and testing given the maternal BRCA2 mutation and her maternal grandmother's negative genetic testing. Ms. Girten will let us know if we can be of any assistance in coordinating genetic counseling and/or testing for appropriate family members.   Ms. Fifer questions were answered to her satisfaction today. Our contact information was provided should additional questions or concerns arise.   Reginia Battie M. Rennie Plowman, MS, [facility] Genetic Counselor [email or web address] (P) [phone]    40 minutes were spent on the date of the encounter in service to the patient including preparation, face-to-face consultation, documentation and care coordination.  The patient was seen alone.  Drs. Gunnar Bulla and/or Mosetta Putt were available to discuss this case as needed.   _______________________________________________________________________ For Office Staff:  Number of people involved in session: 1 Was an Intern/ student involved with case: no

## 2023-12-18 IMAGING — US [facility] FETAL BPP W/O NON-STRESS ADDL GEST
1 series · 13 of 28 positions shown · non-contrast
Comparison: none

[Series 1: [facility] fetal bpp w/o non-stress addl gest · 30 acquisitions, 13 frames shown]
[im 2/30]
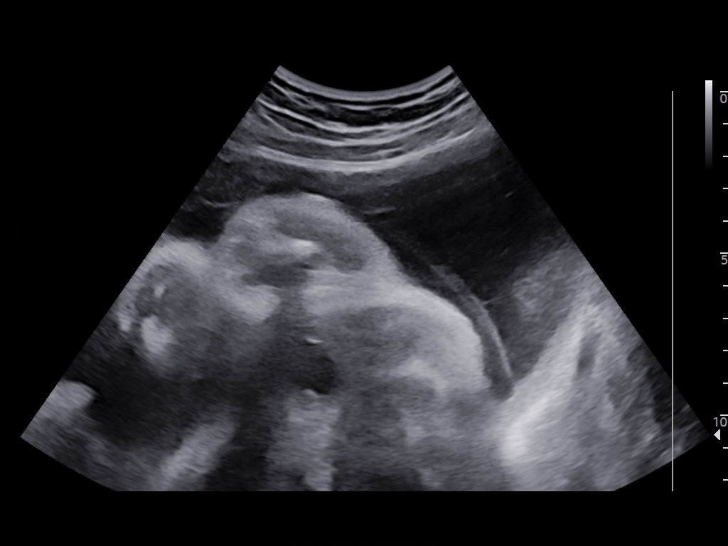
[im 4/30]
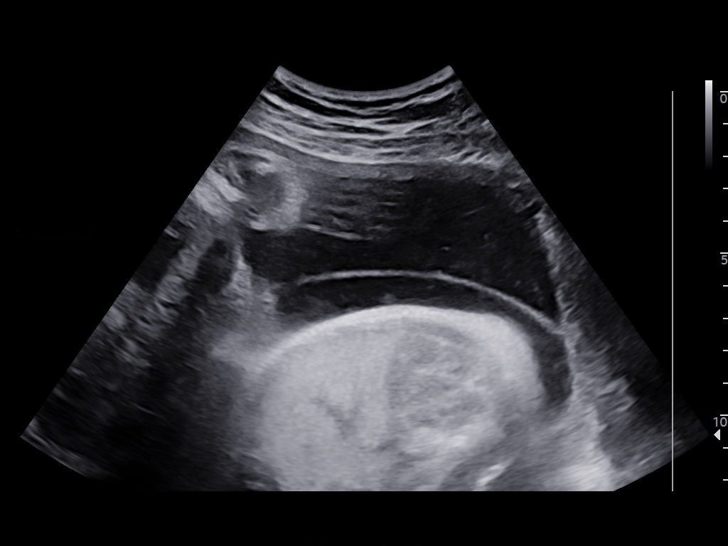
[im 6/30]
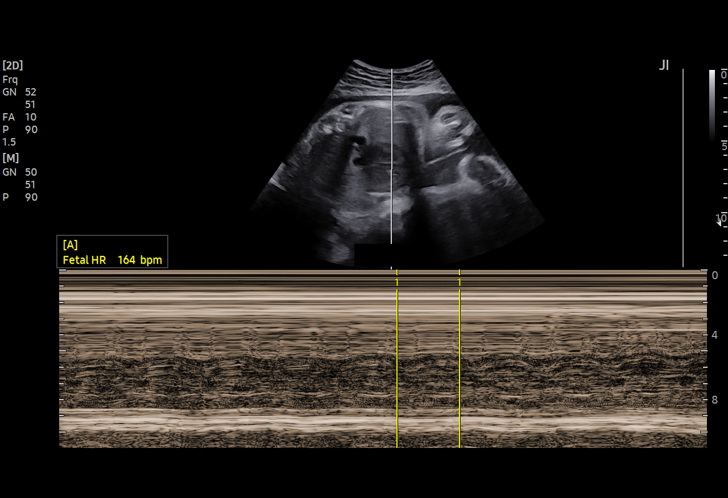
[im 8/30]
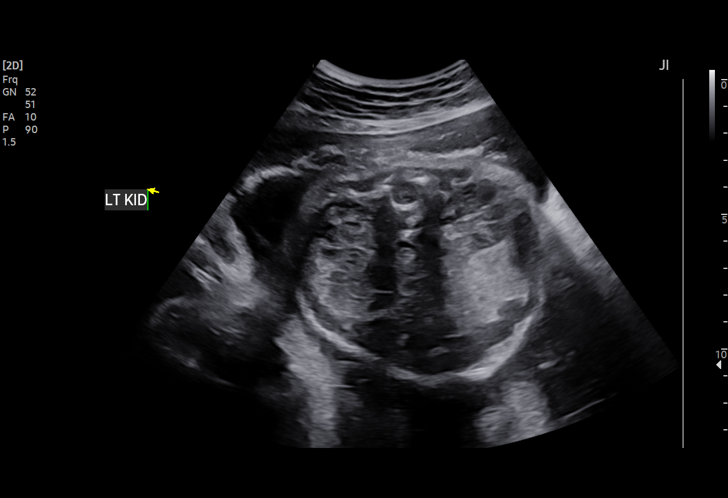
[im 10/30]
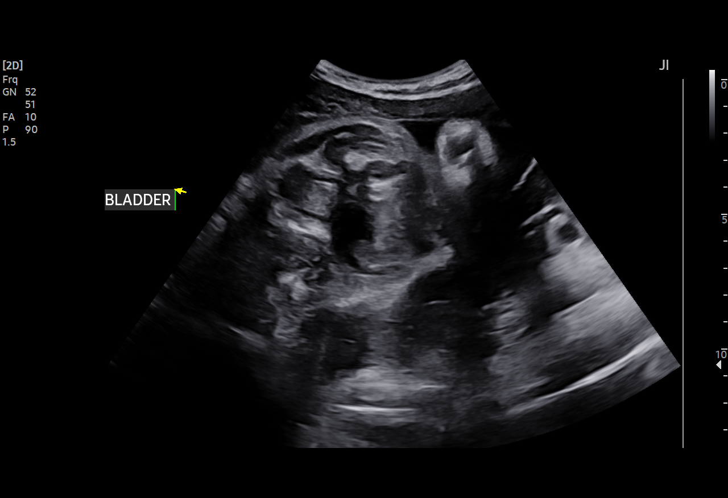
[im 12/30]
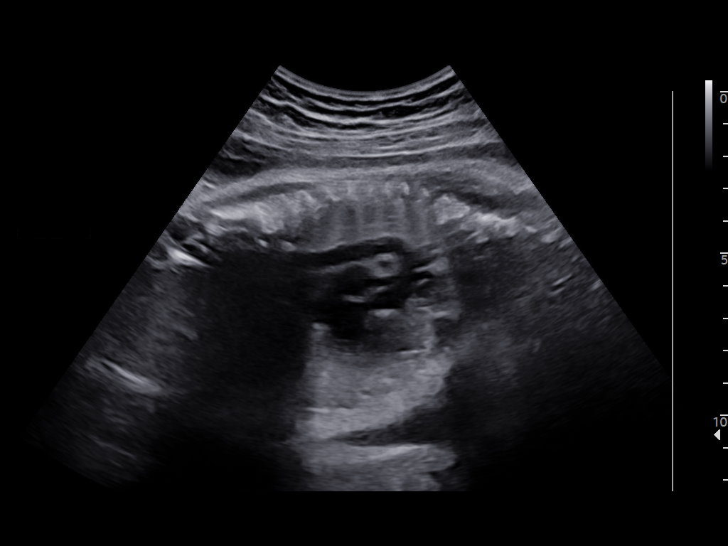
[im 16/30]
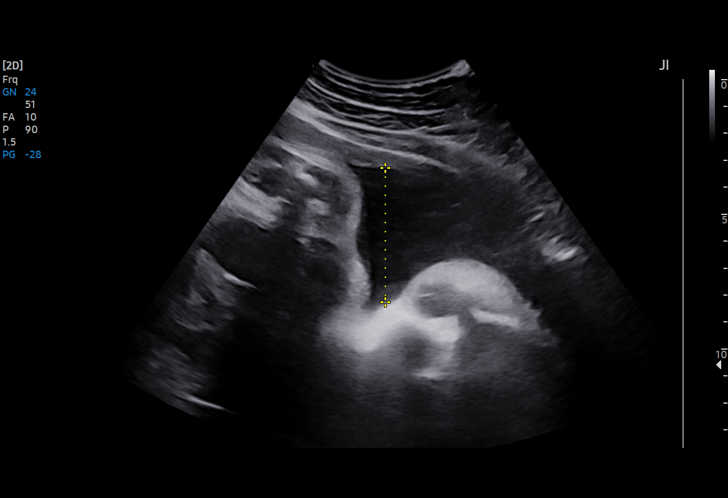
[im 18/30]
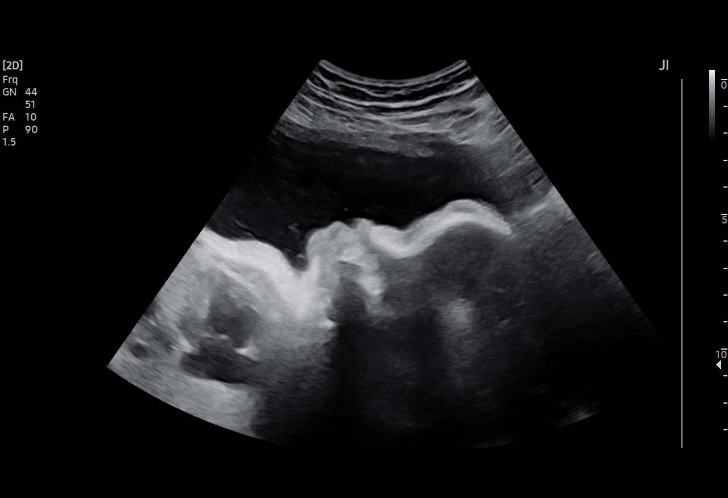
[im 20/30]
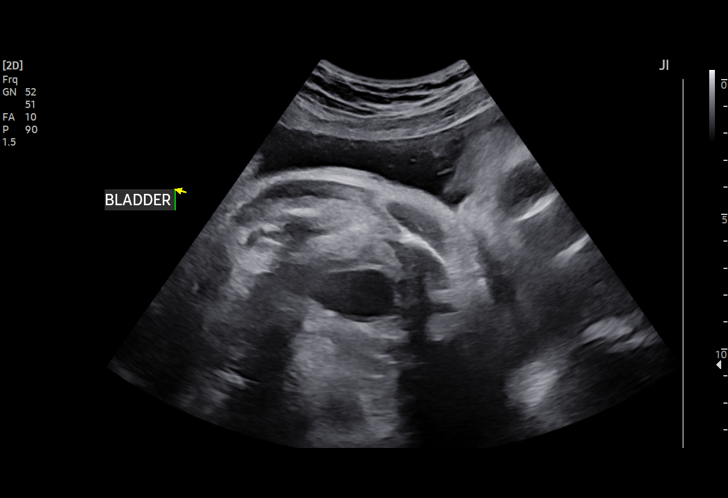
[im 22/30]
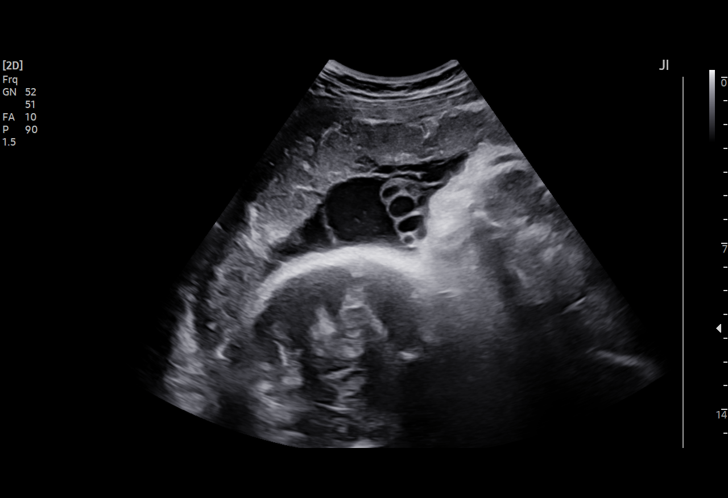
[im 24/30]
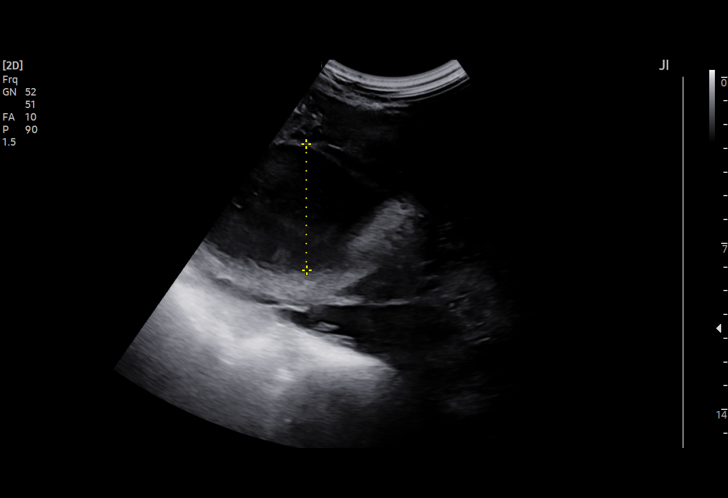
[im 26/30]
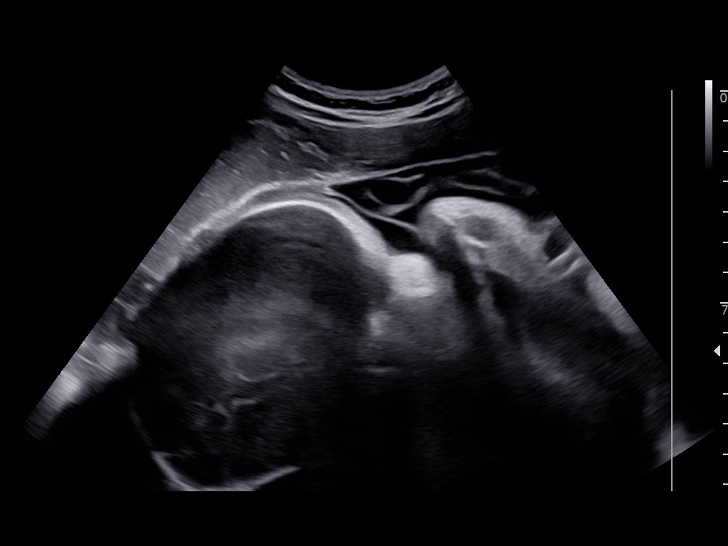
[im 28/30]
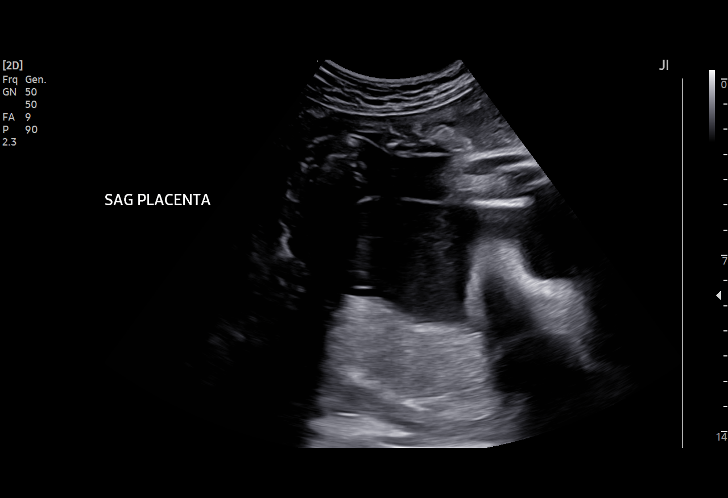

[13 of 28 positions shown; findings below may reference images not displayed]

Attending:        Reese Tiger        Secondary Phy.:   BERDENADZE
                   [id number]

    ADDL GESTATION

Indications

 34 weeks gestation of pregnancy
 Twin pregnancy, di/di, third trimester
 Gestational diabetes in pregnancy, diet
 controlled
 Genetic carrier (silent carrier for alpha
 thalassemia)
 33 weeks gestation of pregnancy
 LR NIPS
Vital Signs

                           Pulse:  104
 BP:          132/81
Fetal Evaluation (Fetus A)

 Num Of Fetuses:         2
 Fetal Heart Rate(bpm):  164
 Cardiac Activity:       Observed
 Fetal Lie:              Maternal left side
 Presentation:           Cephalic
 Placenta:               Anterior
 Amniotic Fluid
 AFI FV:      Within normal limits

                             Largest Pocket(cm)

Biophysical Evaluation (Fetus A)

 Amniotic F.V:   Within normal limits       F. Tone:        Observed
 F. Movement:    Observed                   Score:          [DATE]
 F. Breathing:   Observed
OB History

 Gravidity:    1         Term:   0        Prem:   0        SAB:   0
 TOP:          0       Ectopic:  0        Living: 0
Gestational Age (Fetus A)

 Best:          34w 3d     Det. By:  Previous Ultrasound      EDD:   04/05/22
                                     (09/03/21)
Anatomy (Fetus A)

 Heart:                 Previously seen        Stomach:                Appears normal, left
                                                                       sided
 RVOT:                  Previously seen        Kidneys:                Appear normal
 LVOT:                  Previously seen        Bladder:                Appears normal

Fetal Evaluation (Fetus B)

 Num Of Fetuses:         2
 Fetal Heart Rate(bpm):  149
 Cardiac Activity:       Observed
 Fetal Lie:              Maternal right side
 Presentation:           Breech
 Placenta:               Posterior

 Amniotic Fluid
 AFI FV:      Within normal limits

                             Largest Pocket(cm)

Biophysical Evaluation (Fetus B)

 Amniotic F.V:   Within normal limits       F. Tone:        Observed
 F. Movement:    Observed                   Score:          [DATE]
 F. Breathing:   Observed
Gestational Age (Fetus B)

 Best:          34w 3d     Det. By:  Previous Ultrasound      EDD:   04/05/22
                                     (09/03/21)
Anatomy (Fetus B)
 Heart:                 Previously seen        Abdomen:                Appears normal
 Stomach:               Appears normal, left   Bladder:                Appears normal
                        sided
Impression

 Dichorionic-diamniotic twin pregnancy.
 Gestational diabetes.  Well-controlled on diet.
 Twin A: Maternal left, cephalic presentation, anterior
 placenta.  Amniotic fluid is normal and good fetal activity
 seen.  Antenatal testing is reassuring.  BPP [DATE].
 Twin B: Maternal right, breech presentation, posterior
 placenta. Amniotic fluid is normal and good fetal activity
 seen.  Antenatal testing is reassuring.  BPP [DATE].
Recommendations

 -Continue weekly BPP till delivery.
                Farley, Shilpi

## 2023-12-21 ENCOUNTER — Ambulatory Visit (INDEPENDENT_AMBULATORY_CARE_PROVIDER_SITE_OTHER): Admitting: Licensed Clinical Social Worker

## 2023-12-21 DIAGNOSIS — F411 Generalized anxiety disorder: Secondary | ICD-10-CM | POA: Diagnosis not present

## 2023-12-21 DIAGNOSIS — F1010 Alcohol abuse, uncomplicated: Secondary | ICD-10-CM | POA: Diagnosis not present

## 2023-12-21 DIAGNOSIS — F330 Major depressive disorder, recurrent, mild: Secondary | ICD-10-CM | POA: Diagnosis not present

## 2023-12-21 DIAGNOSIS — F4310 Post-traumatic stress disorder, unspecified: Secondary | ICD-10-CM | POA: Diagnosis not present

## 2023-12-21 NOTE — Progress Notes (Signed)
 Virtual Visit via Video Note  I connected with Davy R. Henzler on 12/21/23 at 11:00am by video enabled telemedicine application and verified that I am speaking with the correct person using two identifiers.   I discussed the limitations, risks, security and privacy concerns of performing an evaluation and management service by video and the availability of in person appointments. I also discussed with the patient that there may be a patient responsible charge related to this service. The patient expressed understanding and agreed to proceed.   I discussed the assessment and treatment plan with the patient. The patient was provided an opportunity to ask questions and all were answered. The patient agreed with the plan and demonstrated an understanding of the instructions.   The patient was advised to call back or seek an in-person evaluation if the symptoms worsen or if the condition fails to improve as anticipated.   I provided 1 hour of non-face-to-face time during this encounter.   Desmond Florida, LCSW, LCAS ________________________   THERAPIST PROGRESS NOTE   Session Time:  11:00am - 12:00pm    Location: Patient: Patient Home Provider: [facility]   Participation Level: Active    Behavioral Response: Alert, casually dressed, anxious mood/affect   Type of Therapy:  Individual Therapy   Treatment Goals addressed: Depression/anxiety management; Medication compliance; Maintaining healthy boundaries with support network   Progress Towards Goals: Progressing   Interventions: CBT, psychoeducation on healthy relationships and boundaries   Summary: Jensen Cheramie is a 23 year old single AA female that presented for therapy appointment today with diagnoses of Generalized Anxiety Disorder; Major depressive disorder, recurrent, mild; PTSD and Alcohol Use Disorder, Mild.   Suicidal/Homicidal: None; without intent or plan.     Therapist Response:  Clinician met with Berdina for a  virtual therapy session and assessed for safety, sobriety, and medication compliance.  Jevon presented for appointment on time and was alert, oriented x5, with no evidence or self-report of active SI/HI or A/V H.  Lalanya reported that she continues taking medication as prescribed and denied abuse of illicit substances.  She reported that she has only drank 1 margarita since our last session, stating "Its been weeks.  I haven't been going out as much".  Clinician inquired about Haeven's emotional ratings today, as well as any significant changes in thoughts, feelings, or behavior since previous check-in.  Ashlinn reported scores of 5/10 for depression, 5/10 for anxiety, and 0/10 for anger/irritability.  Elenie denied any panic attacks or outbursts.  Michell reported that a recent success was getting a boyfriend and dating for the past several weeks.  She reported that a struggle has been worrying about whether this individual is a good fit for her personality and lifestyle, or if she has been overlooking any bad behaviors.  Clinician provided psychoeducation on traits of healthy relationships using a handout to guide discussion with Liel on the subject.  This handout explained how attachments to connections within one's support network (i.e. friends, family, romantic partners, coworkers, Catering manager) can directly influence one's mental health, and emphasized importance of looking for green lights (positive behaviors) that can be considered normal, as well as red lights (harmful behaviors) which suggest that a boundary should be established. Dutchess was tasked with identifying green lights (i.e. respect, trust, appreciation, healthy conflict resolution, etc) and red lights (i.e. contempt, suspicion, impatience, lack of growth, etc) that have been present in past and present relationships, as well as strategies to assertively address these issues before the situation  worsens.  Intervention was effective, as  evidenced by [facility] actively participating in discussion on subject, and identifying more healthy traits than negative ones.  Annaleigha reported that she has known this person for 2 months now, and they have been very patient, respectful, empathetic, and led her to feel safe.  Krystall reported that this is a positive change compared to her last relationship, and acknowledges that past trauma leads her to worry about things eventually going wrong.  She reported that she hopes to be more honest about her concerns with her boyfriend, establish clearer boundaries, and take things slowly.  She stated "I feel a lot better.  I think I was focusing on a couple of things to worry about in the future and overlooking how good things have been going.  He seems like a good catch".   Clinician will continue to monitor.                     Plan: Follow up in 2 weeks.   Diagnosis: Generalized Anxiety Disorder; Major depressive disorder, recurrent, mild; PTSD and Alcohol Use Disorder, Mild.  Collaboration of Care:   None required at this time.                                                   Patient/Guardian was advised Release of Information must be obtained prior to any record release in order to collaborate their care with an outside provider. Patient/Guardian was advised if they have not already done so to contact the registration department to sign all necessary forms in order for us  to release information regarding their care.    Consent: Patient/Guardian gives verbal consent for treatment and assignment of benefits for services provided during this visit. Patient/Guardian expressed understanding and agreed to proceed.   Desmond Florida, [place], LCAS 12/21/23

## 2023-12-24 ENCOUNTER — Encounter: Payer: Self-pay | Admitting: Genetic Counselor

## 2023-12-24 ENCOUNTER — Ambulatory Visit: Payer: Self-pay | Admitting: Genetic Counselor

## 2023-12-24 DIAGNOSIS — Z800 Family history of malignant neoplasm of digestive organs: Secondary | ICD-10-CM

## 2023-12-24 DIAGNOSIS — Z1379 Encounter for other screening for genetic and chromosomal anomalies: Secondary | ICD-10-CM

## 2023-12-24 DIAGNOSIS — Z8481 Family history of carrier of genetic disease: Secondary | ICD-10-CM

## 2023-12-24 DIAGNOSIS — Z803 Family history of malignant neoplasm of breast: Secondary | ICD-10-CM

## 2023-12-24 DIAGNOSIS — Z8041 Family history of malignant neoplasm of ovary: Secondary | ICD-10-CM

## 2023-12-24 NOTE — Progress Notes (Signed)
 HPI:   Ms. Hedgepeth was previously seen in the  Cancer Genetics clinic due to a family history of cancer, a family history of a known BRCA2 gene mutation in her mother, and concerns regarding a hereditary predisposition to cancer.    Ms. Coulson recent genetic test results were disclosed to her by telephone. These results and recommendations are discussed in more detail below.  ONCOLOGY HISTORY:  In April 2025, Ms. Bernhart had a breast ultrasound and subsequent biopsy due to a palpable left breast mass.  Pathology showed a spindle cell proliferation.  She is following up with provider at [facility] later this month.    FAMILY HISTORY:  We obtained a detailed, 4-generation family history.  Significant diagnoses are listed below:      Family History  Problem Relation Age of Onset   Hypertension Mother     BRCA 1/2 Mother          BRCA2 positive   BRCA 1/2 Maternal Aunt          BRCA2 positive   Prostate cancer Maternal Grandfather 63   Cancer Other          breast cancer in MGM's two sisters (~ age 7); pancreatic cancer in MGM's mother (61s); myeloma in MGM's father   Prostate cancer Other          MGF's brother and father (mets); dx 39s-60s   Lung cancer Other          MGF's brother; d. 7   Ovarian cancer Other          PGM's sister           Ms. Guise's mother had genetic testing for the [facility] CancerNext-Expanded +RNAInsight Panel, which showed a single pathogenic variant in BRCA2 called [id number] (p.[id number]*18).  She also has a VUS in BRCA2 called c.449A>G (p.H150R). The CancerNext-Expanded gene panel offered by [facility] and includes sequencing, rearrangement, and RNA analysis for the following 76 genes: AIP, ALK, APC, ATM, AXIN2, BAP1, BARD1, BMPR1A, BRCA1, BRCA2, BRIP1, CDC73, CDH1, CDK4, CDKN1B, CDKN2A, CEBPA, CHEK2, CTNNA1, DDX41, DICER1, ETV6, FH, FLCN, GATA2, LZTR1, MAX, MBD4, MEN1, MET, MLH1, MSH2, MSH3, MSH6, MUTYH, NF1, NF2, NTHL1,  PALB2, PHOX2B, PMS2, POT1, PRKAR1A, PTCH1, PTEN, RAD51C, RAD51D, RB1, RET, RUNX1, SDHA, SDHAF2, SDHB, SDHC, SDHD, SMAD4, SMARCA4, SMARCB1, SMARCE1, STK11, SUFU, TMEM127, TP53, TSC1, TSC2, VHL, and WT1 (sequencing and deletion/duplication); EGFR, HOXB13, KIT, MITF, PDGFRA, POLD1, and POLE (sequencing only); EPCAM and GREM1 (deletion/duplication only).   Ms. McCoy's maternal aunt was shown to have the same BRCA2 mutation as her mother through an [facility] 19 gene panel.     Ms. Maritza maternal grandmother had genetic testing in 2011 through [facility], which was negative with a VUS in BRCA2 called H150R.  Her maternal grandmother's genetic testing included BRCA1/2 sequencing and BRCA1 5-site rearrangement panel only.    She is unaware of any other family history of hereditary cancer genetic testing.    There is no reported Ashkenazi Jewish ancestry. There is no known consanguinity.  GENETIC TEST RESULTS:  The [facility] CancerNext+RNAinsight Panel found no pathogenic mutations.   The [facility] CancerNext+RNAinsight Panel includes sequencing, rearrangement analysis, and RNA analysis for the following 39 genes: APC, ATM, BAP1, BARD1, BMPR1A, BRCA1, BRCA2, BRIP1, CDH1, CDKN2A, CHEK2, FH, FLCN, MET, MLH1, MSH2, MSH6, MUTYH, NF1, NTHL1, PALB2, PMS2, PTEN, RAD51C, RAD51D, SMAD4, STK11, TP53, TSC1, TSC2, and VHL (sequencing and deletion/duplication); AXIN2, HOXB13, MBD4, MSH3, POLD1 and POLE (sequencing only); EPCAM and GREM1 (deletion/duplication  only). . The test report has been scanned into EPIC and is located under the Molecular Pathology section of the Results Review tab.  A portion of the result report is included below for reference. Genetic testing reported out on December 23, 2023.     We recommended Ms. Flessner pursue testing for the familial hereditary cancer gene mutation called BRCA2 [id number] (p.[id number]*18). Ms. Hitchcock test was normal and did not reveal the familial mutation. We call this result a true  negative result because a cancer-causing mutation was identified in Ms. Degroote's family, and she did not inherit it.  Given this negative result, Ms. Boom chances of developing BRCA-related cancers are expected to be about the same as they are in the general population.    ADDITIONAL GENETIC TESTING:   Ms. Calmes genetic testing was fairly extensive.  If there are additional relevant genes identified to increase cancer risk that can be analyzed in the future, we would be happy to discuss and coordinate this testing at that time.     CANCER SCREENING RECOMMENDATIONS:  Ms. Humphres test result is considered negative (normal).  This means that we have not identified the family BRCA2 gene mutation in her sample.  Given this result, BRCA2-related high risk screening is NOT indicated for Ms.Kavanagh. No other hereditary cancer gene mutations were identified.    An individual's cancer risk and medical management are not determined by genetic test results alone. Overall cancer risk assessment incorporates additional factors, including personal medical history, family history, and any available genetic information that may result in a personalized plan for cancer prevention and surveillance. Therefore, it is recommended she continue to follow the cancer management and screening guidelines provided by her  primary healthcare provider.  Her lifetime risk for breast cancer based on Tyrer-Cuzick risk model and reported personal/family history is 8.1%.  This is similar to that of the general population and not in the 'high risk for breast cancer' category per NCCN guidelines (>20%).  This risk estimate can change over time and may be repeated to reflect new information in her personal or family history in the future.  She should continue to follow cancer screening guidelines provided by her providers.      RECOMMENDATIONS FOR FAMILY MEMBERS:   Since she did not inherit a identifiable mutation in a cancer  predisposition gene included on this panel, her children could not have inherited a known mutation from her in one of these genes. Other members of the family may still carry a pathogenic variant in one of these genes that Ms. Frymire did not inherit. Based on the family history of the known BRCA2 gene mutation, we recommend her siblings and her maternal grandfather have genetic counseling and testing. Ms. Difatta can let us  know if we can be of any assistance in coordinating genetic counseling and/or testing for these family member.     FOLLOW-UP:  Cancer genetics is a rapidly advancing field and it is possible that new genetic tests will be appropriate for her and/or her family members in the future. We encourage Ms. Balster to remain in contact with cancer genetics, so we can update her personal and family histories and let her know of advances in cancer genetics that may benefit this family.   Our contact number was provided.  They are welcome to call us  at anytime with additional questions or concerns.   Mihika Surrette M. Nydia, MS, [facility] Genetic Counselor [email or web address] (P) [phone]

## 2023-12-28 ENCOUNTER — Other Ambulatory Visit: Payer: Self-pay | Admitting: Surgery

## 2024-01-11 ENCOUNTER — Ambulatory Visit (INDEPENDENT_AMBULATORY_CARE_PROVIDER_SITE_OTHER): Admitting: Licensed Clinical Social Worker

## 2024-01-11 DIAGNOSIS — F1010 Alcohol abuse, uncomplicated: Secondary | ICD-10-CM

## 2024-01-11 DIAGNOSIS — F411 Generalized anxiety disorder: Secondary | ICD-10-CM | POA: Diagnosis not present

## 2024-01-11 DIAGNOSIS — F4310 Post-traumatic stress disorder, unspecified: Secondary | ICD-10-CM | POA: Diagnosis not present

## 2024-01-11 DIAGNOSIS — F330 Major depressive disorder, recurrent, mild: Secondary | ICD-10-CM

## 2024-01-11 NOTE — Progress Notes (Signed)
 Virtual Visit via Video Note  I connected with Sylvia Walter on 01/11/24 at 2:00pm by video enabled telemedicine application and verified that I am speaking with the correct person using two identifiers.   I discussed the limitations, risks, security and privacy concerns of performing an evaluation and management service by video and the availability of in person appointments. I also discussed with the patient that there may be a patient responsible charge related to this service. The patient expressed understanding and agreed to proceed.   I discussed the assessment and treatment plan with the patient. The patient was provided an opportunity to ask questions and all were answered. The patient agreed with the plan and demonstrated an understanding of the instructions.   The patient was advised to call back or seek an in-person evaluation if the symptoms worsen or if the condition fails to improve as anticipated.   I provided 1 hour of non-face-to-face time during this encounter.   Desmond Florida, LCSW, LCAS ________________________   THERAPIST PROGRESS NOTE   Session Time:  2:00pm  - 3:00pm   Location: Patient: Patient Home Provider: Home Office   Participation Level: Active    Behavioral Response: Alert, casually dressed, anxious mood/affect   Type of Therapy:  Individual Therapy   Treatment Goals addressed: Depression/anxiety management; Medication compliance  Progress Towards Goals: Progressing   Interventions: CBT, mental grounding skills, problem solving     Summary: Sylvia Walter is a 23 year old single AA female that presented for therapy appointment today with diagnoses of Generalized Anxiety Disorder; Major depressive disorder, recurrent, mild; PTSD and Alcohol Use Disorder, Mild.   Suicidal/Homicidal: None; without intent or plan.     Therapist Response:  Clinician met with Sylvia Walter for a virtual therapy appointment and assessed for safety, sobriety, and medication  compliance.  Sylvia Walter presented for session on time and was alert, oriented x5, with no evidence or self-report of active SI/HI or A/V H.  Sylvia Walter reported that she continues taking medication as prescribed and denied abuse of illicit substances.  She reported that she has drank once since we last spoke, when she had 5-6 shots at a concert.  Clinician inquired about Sylvia Walter's current emotional ratings, as well as any significant changes in thoughts, feelings, or behavior since last check-in.  Sylvia Walter reported scores of 2/10 for depression, 7/10 for anxiety, and 3/10 for anger/irritability.  Sylvia Walter denied any panic attacks or outbursts.  Sylvia Walter reported that a recent success has been going on less often, so she can focus more on raising her children, and drinking less alcohol.  She reported that there have been numerous struggles with her daughters, who are "Maybe entering the terrible 2's" and acting out significantly, including peeing on the carpet, and breaking Beth's glasses.  Clinician shared various community resources with Sylvia Walter that could offer parenting classes, including through the [address], [address], and [employer] of the [place].  Sylvia Walter reported that she had to take a timeout for herself yesterday due to extreme anxiety.  Clinician suggested discussion on grounding techniques today with Sylvia Walter.  Clinician explained how grounding techniques can be utilized to temporarily distract from distressing thoughts, or feelings, and covered category of mental grounding techniques with her today, including examples for practice such as describing one's environment in detail, playing a categories game, describing an everyday activity in great detail, using one's imagination to visualize a calming image, counting to 10, and more.  Clinician inquired about which one's Sylvia Walter found appealing, and could see  herself adding to developing coping skillset during treatment.  Intervention was effective,  as evidenced by Sylvia Walter engaging in discussion on the subject and actively trying some of these techniques out in session, noting that they could be helpful if she practices them regularly. She expressed interest in techniques such as describing her environment in detail, playing a categories game involving listing favorite authors or movies, imagining herself relaxing at the beach, reading an engrossing book, or calling a sister that can make her laugh.  Sylvia Walter stated "I'm glad I have these resources and I'm more prepared to handle stress".  Clinician will continue to monitor.                     Plan: Follow up in 2 weeks.   Diagnosis: Generalized Anxiety Disorder; Major depressive disorder, recurrent, mild; PTSD and Alcohol Use Disorder, Mild.  Collaboration of Care:   None required at this time.                                                   Patient/Guardian was advised Release of Information must be obtained prior to any record release in order to collaborate their care with an outside provider. Patient/Guardian was advised if they have not already done so to contact the registration department to sign all necessary forms in order for us  to release information regarding their care.    Consent: Patient/Guardian gives verbal consent for treatment and assignment of benefits for services provided during this visit. Patient/Guardian expressed understanding and agreed to proceed.   Desmond Florida, LCSW, LCAS 01/11/24

## 2024-01-13 ENCOUNTER — Encounter (HOSPITAL_BASED_OUTPATIENT_CLINIC_OR_DEPARTMENT_OTHER): Payer: Self-pay | Admitting: Surgery

## 2024-01-13 ENCOUNTER — Other Ambulatory Visit: Payer: Self-pay

## 2024-01-19 NOTE — H&P (Signed)
 REFERRING PHYSICIAN: Constant, Peggy, MD PROVIDER: Debi Fall, MD MRN: [id number] DOB: [birth year] DATE OF ENCOUNTER: 12/28/2023 Subjective   Chief Complaint: New Consultation (Lt spindle cell proliferation)  History of Present Illness: Sylvia Walter is a 23 y.o. female who is seen today as an office consultation for evaluation of New Consultation (Lt spindle cell proliferation)  This is a 23 year old female with a strong family history of breast cancer with multiple family members being BRCA2 positive who developed a mass in her left breast approximate 3 months ago. She is postpartum and has been breast-feeding. She underwent an ultrasound the mass showing it to measure 2.7 cm at the 12 o'clock position of the left breast 15 cm from the nipple. A biopsy was performed in the breast which showed spindle cells with diffuse proliferation and findings worrisome for breast fibromatosis/desmoid tumor. She has had no previous problems regarding her breast. She has just had genetic testing and she is genetically negative for breast cancer genes. She denies nipple discharge.  Review of Systems: A complete review of systems was obtained from the patient. I have reviewed this information and discussed as appropriate with the patient. See HPI as well for other ROS.  ROS   Medical History: Past Medical History:  Diagnosis Date  Anxiety   There is no problem list on file for this patient.  History reviewed. No pertinent surgical history.   No Known Allergies  Current Outpatient Medications on File Prior to Visit  Medication Sig Dispense Refill  sertraline  (ZOLOFT ) 50 MG tablet Take 50 mg by mouth once daily   No current facility-administered medications on file prior to visit.   Family History  Problem Relation Age of Onset  High blood pressure (Hypertension) Mother    Social History   Tobacco Use  Smoking Status Never  Smokeless Tobacco Never    Social History    Socioeconomic History  Marital status: Single  Tobacco Use  Smoking status: Never  Smokeless tobacco: Never  Vaping Use  Vaping status: Never Used  Substance and Sexual Activity  Alcohol use: Yes  Alcohol/week: 2.0 standard drinks of alcohol  Types: 2 Standard drinks or equivalent per week  Drug use: Never   Objective:   Vitals:  12/28/23 1049  BP: 110/72  Pulse: (!) 133  Temp: 36.7 C (98 F)  SpO2: 98%  Weight: 89.7 kg (197 lb 12.8 oz)  Height: 170.2 cm (5\' 7" )  PainSc: 0-No pain   Body mass index is 30.98 kg/m.  Physical Exam   She appears well on exam.  There is a palpable mass at the 12 o'clock position of the left breast 15 cm from the nipple. It is almost 3 cm in size.  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 Sarcoma cannot be completely ruled out. A left breast lumpectomy is strongly recommended for complete histologic evaluation to rule out a malignancy. I explained this to her in detail especially in light of her family history as well. I explained the surgical procedure in detail. We discussed the risk which includes but is not limited to bleeding, infection, the need for further procedures if the margins are positive, cardiopulmonary issues with anesthesia, postoperative recovery, etc.  She understands and wished to proceed with surgery which will be scheduled.

## 2024-01-20 ENCOUNTER — Ambulatory Visit (HOSPITAL_BASED_OUTPATIENT_CLINIC_OR_DEPARTMENT_OTHER)
Admission: RE | Admit: 2024-01-20 | Discharge: 2024-01-20 | Disposition: A | Discharge status: Home / Self Care | Attending: Surgery | Admitting: Surgery

## 2024-01-20 ENCOUNTER — Encounter (HOSPITAL_BASED_OUTPATIENT_CLINIC_OR_DEPARTMENT_OTHER)
Admission: RE | Disposition: A | Discharge status: Home / Self Care | Payer: Self-pay | Source: Home / Self Care | Attending: Surgery

## 2024-01-20 ENCOUNTER — Ambulatory Visit (HOSPITAL_BASED_OUTPATIENT_CLINIC_OR_DEPARTMENT_OTHER): Admitting: Anesthesiology

## 2024-01-20 ENCOUNTER — Other Ambulatory Visit: Payer: Self-pay

## 2024-01-20 ENCOUNTER — Encounter (HOSPITAL_BASED_OUTPATIENT_CLINIC_OR_DEPARTMENT_OTHER): Payer: Self-pay | Admitting: Surgery

## 2024-01-20 DIAGNOSIS — Z803 Family history of malignant neoplasm of breast: Secondary | ICD-10-CM | POA: Insufficient documentation

## 2024-01-20 DIAGNOSIS — Z01818 Encounter for other preprocedural examination: Secondary | ICD-10-CM

## 2024-01-20 DIAGNOSIS — D48118 Desmoid tumor of other site: Secondary | ICD-10-CM | POA: Insufficient documentation

## 2024-01-20 DIAGNOSIS — N6320 Unspecified lump in the left breast, unspecified quadrant: Secondary | ICD-10-CM

## 2024-01-20 DIAGNOSIS — N6325 Unspecified lump in the left breast, overlapping quadrants: Secondary | ICD-10-CM | POA: Diagnosis present

## 2024-01-20 HISTORY — PX: BREAST LUMPECTOMY: SHX2

## 2024-01-20 LAB — POCT PREGNANCY, URINE: Preg Test, Ur: NEGATIVE

## 2024-01-20 SURGERY — BREAST LUMPECTOMY
Anesthesia: General | Site: Breast | Laterality: Left

## 2024-01-20 MED ORDER — LACTATED RINGERS IV SOLN: INTRAVENOUS | Status: DC

## 2024-01-20 MED ORDER — ONDANSETRON HCL 4 MG/2ML IJ SOLN
INTRAMUSCULAR | Status: DC | PRN
  Administered 2024-01-20: 4 mg via INTRAVENOUS

## 2024-01-20 MED ORDER — KETOROLAC TROMETHAMINE 30 MG/ML IJ SOLN
INTRAMUSCULAR | Status: DC | PRN
  Administered 2024-01-20: 30 mg via INTRAVENOUS

## 2024-01-20 MED ORDER — CEFAZOLIN SODIUM-DEXTROSE 2-4 GM/100ML-% IV SOLN
INTRAVENOUS | Status: AC
  Filled 2024-01-20: qty 100

## 2024-01-20 MED ORDER — ACETAMINOPHEN 500 MG PO TABS
1000.0000 mg | ORAL_TABLET | ORAL | Status: AC
  Administered 2024-01-20: 1000 mg via ORAL

## 2024-01-20 MED ORDER — LIDOCAINE 2% (20 MG/ML) 5 ML SYRINGE
INTRAMUSCULAR | Status: AC
  Filled 2024-01-20: qty 5

## 2024-01-20 MED ORDER — PROPOFOL 500 MG/50ML IV EMUL
INTRAVENOUS | Status: DC | PRN
  Administered 2024-01-20: 200 ug/kg/min via INTRAVENOUS

## 2024-01-20 MED ORDER — ENSURE PRE-SURGERY PO LIQD: 296.0000 mL | Freq: Once | ORAL | Status: DC

## 2024-01-20 MED ORDER — MIDAZOLAM HCL 2 MG/2ML IJ SOLN
INTRAMUSCULAR | Status: AC
  Filled 2024-01-20: qty 2

## 2024-01-20 MED ORDER — 0.9 % SODIUM CHLORIDE (POUR BTL) OPTIME: TOPICAL | Status: DC | PRN

## 2024-01-20 MED ORDER — FENTANYL CITRATE (PF) 100 MCG/2ML IJ SOLN
INTRAMUSCULAR | Status: AC
  Filled 2024-01-20: qty 2

## 2024-01-20 MED ORDER — SCOPOLAMINE 1 MG/3DAYS TD PT72
1.0000 | MEDICATED_PATCH | TRANSDERMAL | Status: DC
  Administered 2024-01-20: 1.5 mg via TRANSDERMAL

## 2024-01-20 MED ORDER — OXYCODONE HCL 5 MG PO TABS: 5.0000 mg | ORAL_TABLET | Freq: Four times a day (QID) | ORAL | 0 refills | Status: AC | PRN

## 2024-01-20 MED ORDER — DEXAMETHASONE SODIUM PHOSPHATE 10 MG/ML IJ SOLN
INTRAMUSCULAR | Status: DC | PRN
  Administered 2024-01-20: 5 mg via INTRAVENOUS

## 2024-01-20 MED ORDER — PROPOFOL 10 MG/ML IV BOLUS
INTRAVENOUS | Status: DC | PRN
  Administered 2024-01-20: 200 mg via INTRAVENOUS
  Administered 2024-01-20: 100 mg via INTRAVENOUS

## 2024-01-20 MED ORDER — BUPIVACAINE-EPINEPHRINE 0.5% -1:200000 IJ SOLN
INTRAMUSCULAR | Status: DC | PRN
  Administered 2024-01-20: 20 mL

## 2024-01-20 MED ORDER — FENTANYL CITRATE (PF) 100 MCG/2ML IJ SOLN
INTRAMUSCULAR | Status: DC | PRN
  Administered 2024-01-20 (×2): 50 ug via INTRAVENOUS

## 2024-01-20 MED ORDER — LIDOCAINE 2% (20 MG/ML) 5 ML SYRINGE
INTRAMUSCULAR | Status: DC | PRN
  Administered 2024-01-20: 30 mg via INTRAVENOUS

## 2024-01-20 MED ORDER — ONDANSETRON HCL 4 MG/2ML IJ SOLN
INTRAMUSCULAR | Status: AC
  Filled 2024-01-20: qty 2

## 2024-01-20 MED ORDER — ACETAMINOPHEN 500 MG PO TABS
ORAL_TABLET | ORAL | Status: AC
  Filled 2024-01-20: qty 2

## 2024-01-20 MED ORDER — OXYCODONE HCL 5 MG PO TABS: 5.0000 mg | ORAL_TABLET | Freq: Once | ORAL | Status: DC | PRN

## 2024-01-20 MED ORDER — MIDAZOLAM HCL 2 MG/2ML IJ SOLN: 0.5000 mg | Freq: Once | INTRAMUSCULAR | Status: DC | PRN

## 2024-01-20 MED ORDER — CEFAZOLIN SODIUM-DEXTROSE 2-4 GM/100ML-% IV SOLN
2.0000 g | INTRAVENOUS | Status: AC
  Administered 2024-01-20: 2 g via INTRAVENOUS

## 2024-01-20 MED ORDER — MIDAZOLAM HCL 5 MG/5ML IJ SOLN
INTRAMUSCULAR | Status: DC | PRN
  Administered 2024-01-20: 2 mg via INTRAVENOUS

## 2024-01-20 MED ORDER — DEXAMETHASONE SODIUM PHOSPHATE 10 MG/ML IJ SOLN
INTRAMUSCULAR | Status: AC
  Filled 2024-01-20: qty 1

## 2024-01-20 MED ORDER — SCOPOLAMINE 1 MG/3DAYS TD PT72
MEDICATED_PATCH | TRANSDERMAL | Status: AC
  Filled 2024-01-20: qty 1

## 2024-01-20 MED ORDER — CHLORHEXIDINE GLUCONATE CLOTH 2 % EX PADS: 6.0000 | MEDICATED_PAD | Freq: Once | CUTANEOUS | Status: DC

## 2024-01-20 MED ORDER — OXYCODONE HCL 5 MG/5ML PO SOLN: 5.0000 mg | Freq: Once | ORAL | Status: DC | PRN

## 2024-01-20 MED ORDER — FENTANYL CITRATE (PF) 100 MCG/2ML IJ SOLN: 25.0000 ug | INTRAMUSCULAR | Status: DC | PRN

## 2024-01-20 SURGICAL SUPPLY — 31 items
BLADE SURG 15 STRL LF DISP TIS (BLADE) ×1 IMPLANT
CANISTER SUCT 1200ML W/VALVE (MISCELLANEOUS) IMPLANT
CHLORAPREP W/TINT 26 (MISCELLANEOUS) ×1 IMPLANT
CLIP TI WIDE RED SMALL 6 (CLIP) IMPLANT
COVER BACK TABLE 60X90IN (DRAPES) ×1 IMPLANT
COVER MAYO STAND STRL (DRAPES) ×1 IMPLANT
DERMABOND ADVANCED .7 DNX12 (GAUZE/BANDAGES/DRESSINGS) ×1 IMPLANT
DRAPE LAPAROTOMY 100X72 PEDS (DRAPES) ×1 IMPLANT
DRAPE UTILITY XL STRL (DRAPES) ×1 IMPLANT
ELECTRODE REM PT RTRN 9FT ADLT (ELECTROSURGICAL) ×1 IMPLANT
GAUZE SPONGE 4X4 12PLY STRL LF (GAUZE/BANDAGES/DRESSINGS) ×1 IMPLANT
GLOVE SURG SIGNA 7.5 PF LTX (GLOVE) ×1 IMPLANT
GOWN STRL REUS W/ TWL LRG LVL3 (GOWN DISPOSABLE) ×1 IMPLANT
GOWN STRL REUS W/ TWL XL LVL3 (GOWN DISPOSABLE) ×1 IMPLANT
KIT MARKER MARGIN INK (KITS) ×1 IMPLANT
NDL HYPO 25X1 1.5 SAFETY (NEEDLE) ×1 IMPLANT
NEEDLE HYPO 25X1 1.5 SAFETY (NEEDLE) ×1 IMPLANT
NS IRRIG 1000ML POUR BTL (IV SOLUTION) ×1 IMPLANT
PACK BASIN DAY SURGERY FS (CUSTOM PROCEDURE TRAY) ×1 IMPLANT
PENCIL SMOKE EVACUATOR (MISCELLANEOUS) ×1 IMPLANT
SLEEVE SCD COMPRESS KNEE MED (STOCKING) IMPLANT
SPIKE FLUID TRANSFER (MISCELLANEOUS) IMPLANT
SPONGE T-LAP 4X18 ~~LOC~~+RFID (SPONGE) ×1 IMPLANT
SUT MNCRL AB 4-0 PS2 18 (SUTURE) ×1 IMPLANT
SUT SILK 2 0 SH (SUTURE) ×1 IMPLANT
SUT VIC AB 3-0 SH 27X BRD (SUTURE) ×1 IMPLANT
SYR CONTROL 10ML LL (SYRINGE) ×1 IMPLANT
TOWEL GREEN STERILE FF (TOWEL DISPOSABLE) ×1 IMPLANT
TRAY FAXITRON CT DISP (TRAY / TRAY PROCEDURE) IMPLANT
TUBE CONNECTING 20X1/4 (TUBING) IMPLANT
YANKAUER SUCT BULB TIP NO VENT (SUCTIONS) IMPLANT

## 2024-01-20 NOTE — Transfer of Care (Signed)
 Immediate Anesthesia Transfer of Care Note  Patient: Sylvia Walter  Procedure(s) Performed: BREAST LUMPECTOMY (Left: Breast)  Patient Location: PACU  Anesthesia Type:General  Level of Consciousness: awake and drowsy  Airway & Oxygen Therapy: Patient Spontanous Breathing and Patient connected to face mask oxygen  Post-op Assessment: Report given to RN and Post -op Vital signs reviewed and stable  Post vital signs: Reviewed and stable  Last Vitals:  Vitals Value Taken Time  BP 97/60 01/20/24 0930  Temp 36.1 C 01/20/24 0930  Pulse 62 01/20/24 0931  Resp 14 01/20/24 0931  SpO2 99 % 01/20/24 0931  Vitals shown include unfiled device data.  Last Pain:  Vitals:   01/20/24 0729  TempSrc: Temporal  PainSc: 0-No pain      Patients Stated Pain Goal: 3 (01/20/24 0729)  Complications: No notable events documented.

## 2024-01-20 NOTE — Anesthesia Preprocedure Evaluation (Addendum)
 Anesthesia Evaluation  Patient identified by MRN, date of birth, ID band Patient awake    Reviewed: Allergy & Precautions, NPO status , Patient's Chart, lab work & pertinent test results  History of Anesthesia Complications Negative for: history of anesthetic complications  Airway Mallampati: I  TM Distance: >3 FB Neck ROM: Full    Dental  (+) Dental Advisory Given, Teeth Intact   Pulmonary neg pulmonary ROS   breath sounds clear to auscultation       Cardiovascular negative cardio ROS  Rhythm:Regular Rate:Normal     Neuro/Psych  PSYCHIATRIC DISORDERS (ADHD)      negative neurological ROS     GI/Hepatic negative GI ROS, Neg liver ROS,,,  Endo/Other  BMI 30  Renal/GU negative Renal ROS     Musculoskeletal   Abdominal   Peds  Hematology negative hematology ROS (+)   Anesthesia Other Findings   Reproductive/Obstetrics                             Anesthesia Physical Anesthesia Plan  ASA: 2  Anesthesia Plan: General   Post-op Pain Management: Tylenol  PO (pre-op)* and Minimal or no pain anticipated   Induction: Intravenous  PONV Risk Score and Plan: 3 and Ondansetron , Dexamethasone and Scopolamine patch - Pre-op  Airway Management Planned: LMA  Additional Equipment: None  Intra-op Plan:   Post-operative Plan:   Informed Consent: I have reviewed the patients History and Physical, chart, labs and discussed the procedure including the risks, benefits and alternatives for the proposed anesthesia with the patient or authorized representative who has indicated his/her understanding and acceptance.     Dental advisory given  Plan Discussed with: CRNA and Surgeon  Anesthesia Plan Comments:         Anesthesia Quick Evaluation

## 2024-01-20 NOTE — Op Note (Signed)
   Sylvia Walter 01/20/2024   Pre-op Diagnosis: LEFT BREAST MASS     Post-op Diagnosis: same  Procedure(s): EXCISION LEFT BREAST MASS  Surgeon(s): [name], MD  Anesthesia: General  Staff:  Circulator: Ricke Charleston, RN; Maureen Sour, RN Scrub Person: Irven Manson  Estimated Blood Loss: Minimal               Specimens: SENT TO PATH  Indications: This is a 23 year old female who presented with a left breast mass at the upper chest at the 12 o'clock position.  Ultrasound showed an almost 3 cm mass.  Biopsy showed spindle cells and possible fibromatosis.  She has a significant family history of breast cancer in her mother who is BRCA2 positive.  The decision was made to proceed with excision of the mass which again is located at the 12 o'clock position  Procedure: The patient was brought to the operating room and identified the correct patient.  She was placed upon the operating room table and general anesthesia was induced.  Her left breast and chest were then prepped and draped in the usual sterile fashion.  The palpable mass was 15 cm from the nipple.  It was right underneath the tattoo.  I anesthetized the skin along the edge of the tattoo overlying the palpable mass and made an incision using the tattoo line with a scalpel.  I then dissected down to the breast tissue with the cautery.  The mass was easily palpable but deep in the breast along the chest wall.  I dissected circumferentially around the mass and took it off of the pectoralis also with electrocautery.  The mass was approximately 3 cm in size.  I marked all margins with paint.  The mass was sent to pathology for evaluation.  I achieved hemostasis with the cautery.  I injected further Marcaine  into the incision.  I then closed the subcutaneous tissue with interrupted 3-0 Vicryl sutures and closed the skin with a running 4-0 Monocryl.  Dermabond was then applied.  The patient tolerated the procedure  well.  All the counts were correct at the end of the procedure.  The patient was then extubated in the operating room and taken in a stable condition to the recovery room.          [name]   Date: 01/20/2024  Time: 9:13 AM

## 2024-01-20 NOTE — Discharge Instructions (Addendum)
 [facility] Office Phone Number [phone]  BREAST BIOPSY/ PARTIAL MASTECTOMY: POST OP INSTRUCTIONS  Always review your discharge instruction sheet given to you by the facility where your surgery was performed.  IF YOU HAVE DISABILITY OR FAMILY LEAVE FORMS, YOU MUST BRING THEM TO THE OFFICE FOR PROCESSING.  DO NOT GIVE THEM TO YOUR DOCTOR.  A prescription for pain medication may be given to you upon discharge.  Take your pain medication as prescribed, if needed.  If narcotic pain medicine is not needed, then you may take acetaminophen  (Tylenol ) or ibuprofen  (Advil ) as needed. Take your usually prescribed medications unless otherwise directed If you need a refill on your pain medication, please contact your pharmacy.  They will contact our office to request authorization.  Prescriptions will not be filled after 5pm or on week-ends. You should eat very light the first 24 hours after surgery, such as soup, crackers, pudding, etc.  Resume your normal diet the day after surgery. Most patients will experience some swelling and bruising in the breast.  Ice packs and a good support bra will help.  Swelling and bruising can take several days to resolve.  It is common to experience some constipation if taking pain medication after surgery.  Increasing fluid intake and taking a stool softener will usually help or prevent this problem from occurring.  A mild laxative (Milk of Magnesia or Miralax) should be taken according to package directions if there are no bowel movements after 48 hours. Unless discharge instructions indicate otherwise, you may remove your bandages 24-48 hours after surgery, and you may shower at that time.  You may have steri-strips (small skin tapes) in place directly over the incision.  These strips should be left on the skin for 7-10 days.  If your surgeon used skin glue on the incision, you may shower in 24 hours.  The glue will flake off over the next 2-3 weeks.  Any  sutures or staples will be removed at the office during your follow-up visit. ACTIVITIES:  You may resume regular daily activities (gradually increasing) beginning the next day.  Wearing a good support bra or sports bra minimizes pain and swelling.  You may have sexual intercourse when it is comfortable. You may drive when you no longer are taking prescription pain medication, you can comfortably wear a seatbelt, and you can safely maneuver your car and apply brakes. RETURN TO WORK:  ______________________________________________________________________________________ Elene Griffes should see your doctor in the office for a follow-up appointment approximately two weeks after your surgery.  Your doctor's nurse will typically make your follow-up appointment when she calls you with your pathology report.  Expect your pathology report 2-3 business days after your surgery.  You may call to check if you do not hear from us  after three days. OTHER INSTRUCTIONS: YOU MAY SHOWER STARTING TOMORROW ICE PACK, TYLENOL , AND IBUPROFEN  ALSO FOR PAIN NO VIGOROUS ACTIVITY FOR ONE WEEK _______________________________________________________________________________________________ _____________________________________________________________________________________________________________________________________ _____________________________________________________________________________________________________________________________________ _____________________________________________________________________________________________________________________________________  WHEN TO CALL YOUR DOCTOR: Fever over 101.0 Nausea and/or vomiting. Extreme swelling or bruising. Continued bleeding from incision. Increased pain, redness, or drainage from the incision.  The clinic staff is available to answer your questions during regular business hours.  Please don't hesitate to call and ask to speak to one of the nurses for clinical  concerns.  If you have a medical emergency, go to the nearest emergency room or call 911.  A surgeon from [facility] is always on call at the hospital.  For further questions, please visit  [email or web address]   May take next dose tylenol  after 1:30pm.  May take ibuprofen  after 3:00pm.   Post Anesthesia Home Care Instructions  Activity: Get plenty of rest for the remainder of the day. A responsible individual must stay with you for 24 hours following the procedure.  For the next 24 hours, DO NOT: -Drive a car -Advertising copywriter -Drink alcoholic beverages -Take any medication unless instructed by your physician -Make any legal decisions or sign important papers.  Meals: Start with liquid foods such as gelatin or soup. Progress to regular foods as tolerated. Avoid greasy, spicy, heavy foods. If nausea and/or vomiting occur, drink only clear liquids until the nausea and/or vomiting subsides. Call your physician if vomiting continues.  Special Instructions/Symptoms: Your throat may feel dry or sore from the anesthesia or the breathing tube placed in your throat during surgery. If this causes discomfort, gargle with warm salt water . The discomfort should disappear within 24 hours.  If you had a scopolamine patch placed behind your ear for the management of post- operative nausea and/or vomiting:  1. The medication in the patch is effective for 72 hours, after which it should be removed.  Wrap patch in a tissue and discard in the trash. Wash hands thoroughly with soap and water . 2. You may remove the patch earlier than 72 hours if you experience unpleasant side effects which may include dry mouth, dizziness or visual disturbances. 3. Avoid touching the patch. Wash your hands with soap and water  after contact with the patch.

## 2024-01-20 NOTE — Interval H&P Note (Signed)
 History and Physical Interval Note:no change in H and P  01/20/2024 8:11 AM  Sylvia Walter  has presented today for surgery, with the diagnosis of LEFT BREAST MASS, DESMOID TUMOR.  The various methods of treatment have been discussed with the patient and family. After consideration of risks, benefits and other options for treatment, the patient has consented to  Procedure(s) with comments: BREAST LUMPECTOMY (Left) - LMA as a surgical intervention.  The patient's history has been reviewed, patient examined, no change in status, stable for surgery.  I have reviewed the patient's chart and labs.  Questions were answered to the patient's satisfaction.     [name]

## 2024-01-20 NOTE — Anesthesia Procedure Notes (Signed)
 Procedure Name: LMA Insertion Date/Time: 01/20/2024 8:40 AM  Performed by: Glorya Larsson, CRNAPre-anesthesia Checklist: Patient identified, Emergency Drugs available, Suction available and Patient being monitored Patient Re-evaluated:Patient Re-evaluated prior to induction Oxygen Delivery Method: Circle System Utilized Preoxygenation: Pre-oxygenation with 100% oxygen Induction Type: IV induction Ventilation: Mask ventilation without difficulty LMA: LMA inserted LMA Size: 4.0 Number of attempts: 1 Airway Equipment and Method: Bite block Placement Confirmation: positive ETCO2 Tube secured with: Tape Dental Injury: Teeth and Oropharynx as per pre-operative assessment

## 2024-01-20 NOTE — Anesthesia Postprocedure Evaluation (Signed)
 Anesthesia Post Note  Patient: Sylvia Walter  Procedure(s) Performed: BREAST LUMPECTOMY (Left: Breast)     Patient location during evaluation: PACU Anesthesia Type: General Level of consciousness: awake and alert, patient cooperative and oriented Pain management: pain level controlled Vital Signs Assessment: post-procedure vital signs reviewed and stable Respiratory status: spontaneous breathing, nonlabored ventilation and respiratory function stable Cardiovascular status: blood pressure returned to baseline and stable Postop Assessment: no apparent nausea or vomiting, able to ambulate and adequate PO intake Anesthetic complications: no   No notable events documented.  Last Vitals:  Vitals:   01/20/24 1001 01/20/24 1015  BP: 108/75 106/65  Pulse: (!) 52 (!) 53  Resp: 14 16  Temp:  (!) 36.2 C  SpO2: 98% 100%    Last Pain:  Vitals:   01/20/24 1015  TempSrc:   PainSc: 0-No pain                 Jilliane Kazanjian,E. Rip Hawes

## 2024-01-21 ENCOUNTER — Encounter (HOSPITAL_BASED_OUTPATIENT_CLINIC_OR_DEPARTMENT_OTHER): Payer: Self-pay | Admitting: Surgery

## 2024-01-21 LAB — SURGICAL PATHOLOGY

## 2024-01-24 ENCOUNTER — Ambulatory Visit (INDEPENDENT_AMBULATORY_CARE_PROVIDER_SITE_OTHER): Admitting: Licensed Clinical Social Worker

## 2024-01-24 DIAGNOSIS — F1010 Alcohol abuse, uncomplicated: Secondary | ICD-10-CM | POA: Diagnosis not present

## 2024-01-24 DIAGNOSIS — F4310 Post-traumatic stress disorder, unspecified: Secondary | ICD-10-CM | POA: Diagnosis not present

## 2024-01-24 DIAGNOSIS — F330 Major depressive disorder, recurrent, mild: Secondary | ICD-10-CM

## 2024-01-24 DIAGNOSIS — F411 Generalized anxiety disorder: Secondary | ICD-10-CM | POA: Diagnosis not present

## 2024-01-24 NOTE — Progress Notes (Signed)
 THERAPIST PROGRESS NOTE   Session Time: 3:10pm - 4:00pm   Location: Patient: [facility]  Provider: [facility]   Participation Level: Active    Behavioral Response: Alert, casually dressed, anxious mood/affect   Type of Therapy:  Individual Therapy   Treatment Goals addressed: Depression/anxiety management; Medication compliance; Maintaining healthy boundaries and developing assertive communication skills   Progress Towards Goals: Progressing   Interventions: CBT, communication skills    Summary: Sylvia Walter is a 23 year old single AA female that presented for therapy appointment today with diagnoses of Generalized Anxiety Disorder; Major depressive disorder, recurrent, mild; PTSD and Alcohol Use Disorder, Mild.   Suicidal/Homicidal: None; without intent or plan.     Therapist Response:  Clinician met with Sylvia Walter for an in person therapy session and assessed for safety, sobriety, and medication compliance.  Sylvia Walter presented for appointment 10 minutes late, but was alert, oriented x5, with no evidence or self-report of active SI/HI or A/V H.  Sylvia Walter reported that she continues taking medication as prescribed and denied abuse of illicit substances.  She reported that she has drank on 3 occasions since last check-in, including 1 glass of wine last night and a few nights ago, and over the weekend having 5 shots of tequila while out with friends.  She denied any consequences of these drinking events.  Clinician inquired about Dorsey's emotional ratings today, as well as any significant changes in thoughts, feelings, or behavior since previous check-in.  Sylvia Walter reported scores of 2/10 for depression, 5/10 for anxiety, and 0/10 for anger/irritability.  Sylvia Walter denied any panic attacks or outbursts.  Sylvia Walter reported that a recent success was having a successful surgery to remove a benign lump from her chest, stating "I was really anxious that it might be cancer, but everything  seems fine now".  She reported that she will continue following up with her provider to monitor this.  She reported that a recent struggle has been having more frequent disagreements with her boyfriend about issues that are important to her, such as political views and race.  She stated "He is very black and white about his thinking, and we can get into cyclical debates".  Clinician covered material with Sylvia Walter on communication skills which could be utilized to increase understanding and support within the relationship.  Clinician presented a handout on 'soft startups' which offered suggestions on how Sylvia Walter could address a problem assertively with her boyfriend, including tips such as choosing an appropriate time/setting, being mindful of maintaining gentle tone, volume and language, while avoiding triggering nonverbals such as rolling eyes, as well as utilizing "I" statements to express feelings, focusing on one problem at a time, and being respectful.  Clinician also reminded Sylvia Walter that use of grounding skills taught from previous therapy sessions could help address difficult feelings such as anger which could arise when feeling triggered by her boyfriend's behavior.  Intervention was effective, as evidenced by Sylvia Walter's active engagement in discussion on subject, and receptiveness to suggestions offered in improving communication and anger management.  Sylvia Walter reported that she is motivated to practice "I" statements more often and be careful about hurtful nonverbal language such as rolling her eyes, since this could impede their ability to connect.  Sylvia Walter stated "Everything else is perfect between us , but this has been really annoying so we need to work on it more".  Clinician will continue to monitor.  Plan: Follow up in 2 weeks.   Diagnosis: Generalized Anxiety Disorder; Major depressive disorder, recurrent, mild; PTSD and Alcohol Use Disorder, Mild.  Collaboration of  Care:   None required at this time.                                                   Patient/Guardian was advised Release of Information must be obtained prior to any record release in order to collaborate their care with an outside provider. Patient/Guardian was advised if they have not already done so to contact the registration department to sign all necessary forms in order for us  to release information regarding their care.    Consent: Patient/Guardian gives verbal consent for treatment and assignment of benefits for services provided during this visit. Patient/Guardian expressed understanding and agreed to proceed.   Desmond Florida, LCSW, LCAS 01/24/24

## 2024-02-08 ENCOUNTER — Ambulatory Visit (INDEPENDENT_AMBULATORY_CARE_PROVIDER_SITE_OTHER): Admitting: Licensed Clinical Social Worker

## 2024-02-08 ENCOUNTER — Encounter (HOSPITAL_COMMUNITY): Payer: Self-pay | Admitting: Emergency Medicine

## 2024-02-08 ENCOUNTER — Other Ambulatory Visit: Payer: Self-pay

## 2024-02-08 ENCOUNTER — Emergency Department (HOSPITAL_COMMUNITY)
Admission: EM | Admit: 2024-02-08 | Discharge: 2024-02-09 | Disposition: A | Discharge status: Home / Self Care | Attending: Emergency Medicine | Admitting: Emergency Medicine

## 2024-02-08 DIAGNOSIS — F1010 Alcohol abuse, uncomplicated: Secondary | ICD-10-CM

## 2024-02-08 DIAGNOSIS — F411 Generalized anxiety disorder: Secondary | ICD-10-CM | POA: Diagnosis not present

## 2024-02-08 DIAGNOSIS — F330 Major depressive disorder, recurrent, mild: Secondary | ICD-10-CM

## 2024-02-08 DIAGNOSIS — F4310 Post-traumatic stress disorder, unspecified: Secondary | ICD-10-CM | POA: Diagnosis not present

## 2024-02-08 DIAGNOSIS — T7621XA Adult sexual abuse, suspected, initial encounter: Secondary | ICD-10-CM | POA: Diagnosis present

## 2024-02-08 DIAGNOSIS — T7421XA Adult sexual abuse, confirmed, initial encounter: Secondary | ICD-10-CM | POA: Insufficient documentation
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
## 2024-02-09 ENCOUNTER — Emergency Department (HOSPITAL_COMMUNITY)
Admission: EM | Admit: 2024-02-09 | Discharge: 2024-02-09 | Disposition: A | Discharge status: Home / Self Care | Source: Home / Self Care | Attending: Student | Admitting: Student

## 2024-02-09 ENCOUNTER — Encounter (HOSPITAL_COMMUNITY): Payer: Self-pay | Admitting: *Deleted

## 2024-02-09 ENCOUNTER — Other Ambulatory Visit: Payer: Self-pay

## 2024-02-09 DIAGNOSIS — T7621XA Adult sexual abuse, suspected, initial encounter: Secondary | ICD-10-CM | POA: Insufficient documentation

## 2024-02-09 MED ORDER — ULIPRISTAL ACETATE 30 MG PO TABS
30.0000 mg | ORAL_TABLET | Freq: Once | ORAL | Status: AC
  Administered 2024-02-09: 30 mg via ORAL
  Filled 2024-02-09: qty 1

## 2024-02-09 MED ORDER — ONDANSETRON 4 MG PO TBDP
4.0000 mg | ORAL_TABLET | Freq: Once | ORAL | Status: AC
  Administered 2024-02-09: 4 mg via ORAL
  Filled 2024-02-09: qty 1

## 2024-02-09 MED ORDER — BICTEGRAVIR-EMTRICITAB-TENOFOV 50-200-25 MG PO PREPACK: 1.0000 | ORAL_TABLET | Freq: Every day | ORAL | Status: DC

## 2024-02-09 MED ORDER — METRONIDAZOLE 500 MG PO TABS
2000.0000 mg | ORAL_TABLET | Freq: Once | ORAL | Status: AC
  Administered 2024-02-09: 2000 mg via ORAL
  Filled 2024-02-09: qty 4

## 2024-02-09 MED ORDER — AZITHROMYCIN 250 MG PO TABS
1000.0000 mg | ORAL_TABLET | Freq: Once | ORAL | Status: AC
  Administered 2024-02-09: 1000 mg via ORAL
  Filled 2024-02-09: qty 4

## 2024-02-09 MED ORDER — BICTEGRAVIR-EMTRICITAB-TENOFOV 50-200-25 MG PO TABS: 1.0000 | ORAL_TABLET | Freq: Every day | ORAL | 0 refills | Status: AC

## 2024-02-09 MED ORDER — CEFIXIME 400 MG PO CAPS
800.0000 mg | ORAL_CAPSULE | Freq: Once | ORAL | Status: AC
  Administered 2024-02-09: 800 mg via ORAL
  Filled 2024-02-09: qty 2
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
## 2024-02-09 NOTE — Discharge Instructions (Signed)
 Please return to the ER after 7am for completion of your examination.

## 2024-02-09 NOTE — ED Notes (Signed)
 Patient refusing labs and swabs stating she had to go now because she had to get her children. PA made aware.

## 2024-02-09 NOTE — Discharge Instructions (Addendum)
 Return if any problems.  Call [facility] at  [address] [place] [address] [phone]
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
## 2024-02-09 NOTE — SANE Note (Signed)
 Spoke with the patient on the phone and gave an extensive explanation on her options and what all was included in the kit.  Patient decided against the kit and wanted he STD medications. nPEP was given the night before by the PA.   Spoke with Lynnie Saucier, PA that is on tonight and discussed the patients plan of care.
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
## 2024-03-09 ENCOUNTER — Ambulatory Visit (HOSPITAL_COMMUNITY): Admitting: Licensed Clinical Social Worker

## 2024-03-09 DIAGNOSIS — F330 Major depressive disorder, recurrent, mild: Secondary | ICD-10-CM

## 2024-03-09 DIAGNOSIS — F411 Generalized anxiety disorder: Secondary | ICD-10-CM | POA: Diagnosis not present

## 2024-03-09 DIAGNOSIS — F1010 Alcohol abuse, uncomplicated: Secondary | ICD-10-CM | POA: Diagnosis not present

## 2024-03-09 DIAGNOSIS — F4310 Post-traumatic stress disorder, unspecified: Secondary | ICD-10-CM | POA: Diagnosis not present
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
## 2024-03-20 ENCOUNTER — Telehealth (HOSPITAL_COMMUNITY): Admitting: Family

## 2024-03-20 DIAGNOSIS — F330 Major depressive disorder, recurrent, mild: Secondary | ICD-10-CM

## 2024-03-20 MED ORDER — SERTRALINE HCL 50 MG PO TABS: 50.0000 mg | ORAL_TABLET | Freq: Every day | ORAL | 1 refills | Status: DC

## 2024-03-20 NOTE — Progress Notes (Addendum)
 Virtual Visit via Video Note  I connected with Sylvia Walter on 03/30/24 at  3:30 PM EDT by a video enabled telemedicine application and verified that I am speaking with the correct person using two identifiers.  Location: Patient: Home Provider: Office   I discussed the limitations of evaluation and management by telemedicine and the availability of in person appointments. The patient expressed understanding and agreed to proceed.    I discussed the assessment and treatment plan with the patient. The patient was provided an opportunity to ask questions and all were answered. The patient agreed with the plan and demonstrated an understanding of the instructions.   The patient was advised to call back or seek an in-person evaluation if the symptoms worsen or if the condition fails to improve as anticipated.  I provided 20 minutes of non-face-to-face time during this encounter.   Staci LOISE Kerns, NP   [facility] MD/PA/NP OP Progress Note  03/30/2024 10:59 AM Sylvia Walter  MRN:  [id number]  Chief Complaint: Medication management  HPI: Sylvia Walter is a 23 year old African-American female who presents for medication management follow-up appointment.  She carries a diagnosis related to depression and anxiety.  States overall her mood is stabilized since being on medication.  She is currently prescribed Zoloft  50 mg daily which she reports she has been taking and tolerating well.  Also is currently attending therapy sessions with C.Bates.  States sessions are going well.  States utilizing coping skills that she has learned.  Stated  I know that I can take a time out, even if it's 10 minutes to myself when I am feeling overwhelmed or stressed when I am with my 40-year-old.  She reports a good appetite.  States she is rested well throughout the night.  Reports anxiety and depression symptoms as well.  No adjustments to medications needed at this visit  Sylvia Walter's mood is congruent she is alert  oriented x 3.  Presents with a bright pleasant affect seen and evaluated via caregility. does not appear to be responding to internal or external stimuli denied any paranoia Seidel ideations.  Denies auditory or visual hallucinations.  She answered all questions appropriate throughout this assessment.  Patient will follow-up 3 months for medication management follow-up appointment.  States she has a follow-up therapy session on 03/21/2024.  Support encouragement and reassurance was provided    Visit Diagnosis:    ICD-10-CM   1. Major depressive disorder, recurrent episode, mild (HCC)  F33.0       Past Psychiatric History:   Past Medical History:  Past Medical History:  Diagnosis Date   ADHD    Alpha thalassemia silent carrier 10/25/2021    Past Surgical History:  Procedure Laterality Date   BREAST BIOPSY Left 12/08/2023   US  LT BREAST BX W LOC DEV 1ST LESION IMG BX SPEC US  GUIDE 12/08/2023 [facility]   BREAST LUMPECTOMY Left 01/20/2024   Procedure: BREAST LUMPECTOMY;  Surgeon: Vernetta Berg, MD;  Location: [facility];  Service: General;  Laterality: Left;  LMA   ROOT CANAL Left 07/31/2020   VAGINAL DELIVERY N/A 03/18/2022   Procedure: VAGINAL DELIVERY multiple gestation;  Surgeon: Herchel Gloris LABOR, MD;  Location: [facility];  Service: Obstetrics;  Laterality: N/A;    Family Psychiatric History:   Family History:  Family History  Problem Relation Age of Onset   Hypertension Mother    BRCA 1/2 Mother        BRCA2 positive   BRCA 1/2  Maternal Aunt        BRCA2 positive   Prostate cancer Maternal Grandfather 19   Cancer Other        breast cancer in MGM's two sisters (~ age 44); pancreatic cancer in MGM's mother (37s); myeloma in MGM's father   Prostate cancer Other        MGF's brother and father (mets); dx 26s-60s   Lung cancer Other        MGF's brother; d. 48   Ovarian cancer Other        PGM's sister    Social History:  Social History    Socioeconomic History   Marital status: Single    Spouse name: Not on file   Number of children: Not on file   Years of education: Not on file   Highest education level: Not on file  Occupational History   Not on file  Tobacco Use   Smoking status: Never   Smokeless tobacco: Never  Vaping Use   Vaping status: Never Used  Substance and Sexual Activity   Alcohol use: Not Currently    Comment: occ, prior to pregnancy   Drug use: Never   Sexual activity: Not Currently    Partners: Male    Birth control/protection: None  Other Topics Concern   Not on file  Social History Narrative   Not on file   Social Drivers of Health   Financial Resource Strain: Not on file  Food Insecurity: Not at Risk (02/04/2024)   Received from [employer] Insecurity    Within the past 12 months, the food you bought just didn't last and you didn't have enough money to get more.: 1  Transportation Needs: Not at Risk (02/04/2024)   Received from [employer] Needs    In the past 12 months, has lack of transportation kept you from medical appointments, meetings, work or from getting things needed for daily living? (Check all that apply): 1  Physical Activity: Not on file  Stress: Not on file  Social Connections: Not on file    Allergies: No Known Allergies  Metabolic Disorder Labs: Lab Results  Component Value Date   HGBA1C 5.8 (H) 04/20/2023   No results found for: PROLACTIN No results found for: CHOL, TRIG, HDL, CHOLHDL, VLDL, LDLCALC Lab Results  Component Value Date   TSH 1.290 04/20/2023    Therapeutic Level Labs: No results found for: LITHIUM No results found for: VALPROATE No results found for: CBMZ  Current Medications: Current Outpatient Medications  Medication Sig Dispense Refill   oxyCODONE  (OXY IR/ROXICODONE ) 5 MG immediate release tablet Take 1 tablet (5 mg total) by mouth every 6 (six) hours as needed for moderate pain (pain score 4-6) or  severe pain (pain score 7-10). 20 tablet 0   sertraline  (ZOLOFT ) 50 MG tablet Take 1 tablet (50 mg total) by mouth daily. 90 tablet 1   No current facility-administered medications for this visit.     Musculoskeletal: Virtual assessment  Psychiatric Specialty Exam: Review of Systems  Psychiatric/Behavioral:  Negative for decreased concentration, sleep disturbance and suicidal ideas. The patient is not nervous/anxious.   All other systems reviewed and are negative.   currently breastfeeding.There is no height or weight on file to calculate BMI.  General Appearance: Casual  Eye Contact:  Good  Speech:  Clear and Coherent  Volume:  Normal  Mood:  Euthymic  Affect:  Congruent  Thought Process:  Coherent  Orientation:  Full (Time, Place, and  Person)  Thought Content: Logical   Suicidal Thoughts:  No  Homicidal Thoughts:  No  Memory:  Immediate;   Good Recent;   Good  Judgement:  Good  Insight:  Good  Psychomotor Activity:  Normal  Concentration:  Concentration: Good  Recall:  Good  Fund of Knowledge: Good  Language: Good  Akathisia:  No  Handed:  Right  AIMS (if indicated): not done  Assets:  Communication Skills Desire for Improvement Resilience Social Support  ADL's:  Intact  Cognition: WNL  Sleep:  Good   Screenings: AUDIT    Advertising copywriter from 11/10/2023 in [facility] at [facility]  Alcohol Use Disorder Identification Test Final Score (AUDIT) 11   CAGE-AID    Flowsheet Row Counselor from 11/10/2023 in [facility] at [place]  CAGE-AID Score 3   GAD-7    Flowsheet Row Counselor from 09/22/2023 in [facility] at [facility] Visit from 04/20/2023 in [facility] for [facility] at [facility] Routine Prenatal from 01/30/2022 in [facility] for [facility] at [place] Clinical Support from 09/29/2021 in [facility] for [facility]  at [place]  Total GAD-7 Score 20 11 0 3   PHQ2-9    Flowsheet Row Counselor from 09/22/2023 in [facility] at [facility] Visit from 09/13/2023 in [facility] Office Visit from 04/20/2023 in [facility] for [facility] at [place] Nutrition from 02/18/2022 in [facility] [place] Routine Prenatal from 01/30/2022 in [facility] for [facility] at [facility] Total Score 2 3 2  0 0  PHQ-9 Total Score 19 18 9  -- 1   Flowsheet Row ED from 02/09/2024 in [facility] at [facility] ED from 02/08/2024 in [facility] at [facility] Admission (Discharged) from 01/20/2024 in [facility]  C-SSRS RISK CATEGORY No Risk No Risk No Risk     Assessment and Plan: Lousie McCoy 23 year old African-American female presents for medication management follow-up appointment.  She carries a diagnosis related to depression and anxiety.  She is currently prescribed Zoloft  50 mg daily which she reports she has been taking and tolerating well.  No medication side effects.  States feeling slightly tired at times and has tried to take medication at night however states she feels best when she takes medication throughout the day.   Probably a migraine but I feel like it last longer denied concerns related to sleep disturbance and/or appetite concerns.  Overall mood is stabilized.  Patient to continue to follow-up with therapy services with Darleene B. LCSW.  Follow-up 3 months for medication management  Collaboration of Care: Collaboration of Care: Medication Management AEB continue Zoloft  50 mg daily  Patient/Guardian was advised Release of Information must be obtained prior to any record release in order to collaborate their care with an outside provider. Patient/Guardian was advised if they have not already done so to contact the  registration department to sign all necessary forms in order for us  to release information regarding their care.   Consent: Patient/Guardian gives verbal consent for treatment and assignment of benefits for services provided during this visit. Patient/Guardian expressed understanding and agreed to proceed.    Staci LOISE Kerns, NP 03/30/2024, 10:59 AM

## 2024-03-21 ENCOUNTER — Ambulatory Visit (INDEPENDENT_AMBULATORY_CARE_PROVIDER_SITE_OTHER): Admitting: Licensed Clinical Social Worker

## 2024-03-21 DIAGNOSIS — F330 Major depressive disorder, recurrent, mild: Secondary | ICD-10-CM | POA: Diagnosis not present

## 2024-03-21 DIAGNOSIS — F1010 Alcohol abuse, uncomplicated: Secondary | ICD-10-CM

## 2024-03-21 DIAGNOSIS — F4310 Post-traumatic stress disorder, unspecified: Secondary | ICD-10-CM | POA: Diagnosis not present

## 2024-03-21 DIAGNOSIS — F411 Generalized anxiety disorder: Secondary | ICD-10-CM | POA: Diagnosis not present
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
## 2024-05-07 ENCOUNTER — Emergency Department (HOSPITAL_COMMUNITY)
Admission: EM | Admit: 2024-05-07 | Discharge: 2024-05-08 | Disposition: A | Discharge status: Home / Self Care | Attending: Emergency Medicine | Admitting: Emergency Medicine

## 2024-05-07 ENCOUNTER — Encounter (HOSPITAL_COMMUNITY): Payer: Self-pay | Admitting: Radiology

## 2024-05-07 ENCOUNTER — Emergency Department (HOSPITAL_COMMUNITY)

## 2024-05-07 ENCOUNTER — Other Ambulatory Visit: Payer: Self-pay

## 2024-05-07 DIAGNOSIS — R0602 Shortness of breath: Secondary | ICD-10-CM | POA: Insufficient documentation

## 2024-05-07 DIAGNOSIS — R0789 Other chest pain: Secondary | ICD-10-CM | POA: Diagnosis present

## 2024-05-07 LAB — CBC
HCT: 40 % (ref 36.0–46.0)
Hemoglobin: 12 g/dL (ref 12.0–15.0)
MCH: 23.4 pg — ABNORMAL LOW (ref 26.0–34.0)
MCHC: 30 g/dL (ref 30.0–36.0)
MCV: 78.1 fL — ABNORMAL LOW (ref 80.0–100.0)
Platelets: 301 K/uL (ref 150–400)
RBC: 5.12 MIL/uL — ABNORMAL HIGH (ref 3.87–5.11)
RDW: 14.4 % (ref 11.5–15.5)
WBC: 11.6 K/uL — ABNORMAL HIGH (ref 4.0–10.5)
nRBC: 0 % (ref 0.0–0.2)

## 2024-05-07 LAB — HCG, SERUM, QUALITATIVE: Preg, Serum: NEGATIVE

## 2024-05-07 NOTE — ED Triage Notes (Signed)
 Pt arrives POV with complaints of centralized chest pain that started yesterday. Pain is constant but worsens on exertion.

## 2024-05-07 NOTE — ED Triage Notes (Signed)
 Endorses having chest pain for the past 2 days with shortness of breath. Pain is constant but worsened by pushing on the chest. She endorses having shortness of breath that started a few hours ago. She states that its difficult to pick her child up due to the pain. Pain was a gradual onset.

## 2024-05-08 LAB — TROPONIN I (HIGH SENSITIVITY): Troponin I (High Sensitivity): 2 ng/L (ref <18)

## 2024-05-08 LAB — RESP PANEL BY RT-PCR (RSV, FLU A&B, COVID)  RVPGX2
Influenza A by PCR: NEGATIVE
Influenza B by PCR: NEGATIVE
Resp Syncytial Virus by PCR: NEGATIVE
SARS Coronavirus 2 by RT PCR: NEGATIVE

## 2024-05-08 LAB — BASIC METABOLIC PANEL WITH GFR
Anion gap: 10 (ref 5–15)
BUN: 5 mg/dL — ABNORMAL LOW (ref 6–20)
CO2: 21 mmol/L — ABNORMAL LOW (ref 22–32)
Calcium: 9.3 mg/dL (ref 8.9–10.3)
Chloride: 108 mmol/L (ref 98–111)
Creatinine, Ser: 0.58 mg/dL (ref 0.44–1.00)
GFR, Estimated: >60 mL/min (ref >60)
Glucose, Bld: 99 mg/dL (ref 70–99)
Potassium: 3.5 mmol/L (ref 3.5–5.1)
Sodium: 139 mmol/L (ref 135–145)

## 2024-05-08 NOTE — ED Provider Notes (Signed)
 [place] EMERGENCY DEPARTMENT AT [facility] Provider Note   CSN: [id number] Arrival date & time: 05/07/24  2253     Patient presents with: Chest Pain and Shortness of Breath   Sylvia Walter is a 23 y.o. female.  Patient with past medical history significant ADHD presents to the emergency department complaining of centralized chest pain which began yesterday.  She states the pain is worse on exertion with tenderness noted in her chest wall.  She endorses mild shortness of breath.  When she lifts her child she has increased pain in her chest wall.  She denies abdominal pain, nausea, vomiting, diaphoresis.  Pain was reported to be gradual in onset.  The patient does endorse starting a jogging routine approximately 2 weeks ago    Chest Pain Associated symptoms: shortness of breath   Shortness of Breath Associated symptoms: chest pain        Prior to Admission medications   Medication Sig Start Date End Date Taking? Authorizing Provider  oxyCODONE  (OXY IR/ROXICODONE ) 5 MG immediate release tablet Take 1 tablet (5 mg total) by mouth every 6 (six) hours as needed for moderate pain (pain score 4-6) or severe pain (pain score 7-10). 01/20/24   Sylvia Berg, MD  sertraline  (ZOLOFT ) 50 MG tablet Take 1 tablet (50 mg total) by mouth daily. 03/20/24 03/20/25  Sylvia Staci SAILOR, NP    Allergies: Patient has no known allergies.    Review of Systems  Respiratory:  Positive for shortness of breath.   Cardiovascular:  Positive for chest pain.    Updated Vital Signs BP 129/85 (BP Location: Right Arm)   Pulse 85   Temp 98.2 F (36.8 C)   Resp 18   Ht 5' 7 (1.702 m)   Wt 86.2 kg   SpO2 98%   BMI 29.76 kg/m   Physical Exam Vitals and nursing note reviewed.  Constitutional:      General: She is not in acute distress.    Appearance: She is well-developed.  HENT:     Head: Normocephalic and atraumatic.  Eyes:     Conjunctiva/sclera: Conjunctivae normal.   Cardiovascular:     Rate and Rhythm: Normal rate and regular rhythm.     Heart sounds: No murmur heard. Pulmonary:     Effort: Pulmonary effort is normal. No respiratory distress.     Breath sounds: Normal breath sounds.  Chest:     Chest wall: Tenderness present.  Abdominal:     Palpations: Abdomen is soft.     Tenderness: There is no abdominal tenderness.  Musculoskeletal:        General: No swelling.     Cervical back: Neck supple.  Skin:    General: Skin is warm and dry.     Capillary Refill: Capillary refill takes less than 2 seconds.  Neurological:     Mental Status: She is alert.  Psychiatric:        Mood and Affect: Mood normal.     (all labs ordered are listed, but only abnormal results are displayed) Labs Reviewed  BASIC METABOLIC PANEL WITH GFR - Abnormal; Notable for the following components:      Result Value   CO2 21 (*)    BUN 5 (*)    All other components within normal limits  CBC - Abnormal; Notable for the following components:   WBC 11.6 (*)    RBC 5.12 (*)    MCV 78.1 (*)    MCH 23.4 (*)  All other components within normal limits  RESP PANEL BY RT-PCR (RSV, FLU A&B, COVID)  RVPGX2  HCG, SERUM, QUALITATIVE  TROPONIN I (HIGH SENSITIVITY)    EKG: None  Radiology: DG Chest 2 View Result Date: 05/07/2024 CLINICAL DATA:  Chest pain EXAM: CHEST - 2 VIEW COMPARISON:  Chest x-ray 11/26/2004 FINDINGS: The heart size and mediastinal contours are within normal limits. Both lungs are clear. The visualized skeletal structures are unremarkable. IMPRESSION: No active cardiopulmonary disease. Electronically Signed   By: Greig Pique M.D.   On: 05/07/2024 23:38     Procedures   Medications Ordered in the ED - No data to display                                  Medical Decision Making Amount and/or Complexity of Data Reviewed Labs: ordered. Radiology: ordered.   This patient presents to the ED for concern of chest pain, this involves an extensive  number of treatment options, and is a complaint that carries with it a high risk of complications and morbidity.  The differential diagnosis includes musculoskeletal pain, anxiety, ACS, pneumonia, PE, others   Co morbidities / Chronic conditions that complicate the patient evaluation  ADHD   Additional history obtained:  Additional history obtained from EMR External records from outside source obtained and reviewed including note from surgery, patient had breast mass removed on August 15   Lab Tests:  I Ordered, and personally interpreted labs.  The pertinent results include: Grossly unremarkable CBC, BMP.  Negative pregnancy test, troponin 2, negative respiratory panel   Imaging Studies ordered:  I ordered imaging studies including chest x-ray I independently visualized and interpreted imaging which showed no active disease I agree with the radiologist interpretation   Cardiac Monitoring: / EKG:  The patient was maintained on a cardiac monitor.  I personally viewed and interpreted the cardiac monitored which showed an underlying rhythm of: Sinus rhythm   Test / Admission - Considered:  Patient with nonischemic EKG, troponin of 2.  Presentation not consistent with ACS.  No recent travel, period of bedrest, cancer history.  Patient is not tachycardic or short of breath upon my exam.  Presentation not consistent at this time with pulmonary embolism.  She does have chest wall tenderness across the sternum and upper chest.  Question musculoskeletal strain due to recent exercise routine versus inflammation from recent chest wall excision.  Patient recommended to treat symptomatically at home with Tylenol  as she is currently breast-feeding.  Patient to follow-up primary care for further evaluation as needed.  Return precautions provided      Final diagnoses:  Chest wall pain    ED Discharge Orders     None          Sylvia Walter 05/08/24 0234    Sylvia Lenis, MD 05/08/24 [phone]

## 2024-05-08 NOTE — Discharge Instructions (Signed)
 Your workup this evening was reassuring.  You may take Tylenol  at home for pain control.  If you develop worsening symptoms return to the emergency department.  Otherwise please follow-up with your primary care provider for further evaluation.

## 2024-06-13 ENCOUNTER — Other Ambulatory Visit: Payer: Self-pay | Admitting: Surgery

## 2024-06-13 DIAGNOSIS — R222 Localized swelling, mass and lump, trunk: Secondary | ICD-10-CM

## 2024-06-15 ENCOUNTER — Ambulatory Visit (INDEPENDENT_AMBULATORY_CARE_PROVIDER_SITE_OTHER): Admitting: Licensed Clinical Social Worker

## 2024-06-15 DIAGNOSIS — F1010 Alcohol abuse, uncomplicated: Secondary | ICD-10-CM

## 2024-06-15 DIAGNOSIS — F330 Major depressive disorder, recurrent, mild: Secondary | ICD-10-CM

## 2024-06-15 DIAGNOSIS — F4310 Post-traumatic stress disorder, unspecified: Secondary | ICD-10-CM

## 2024-06-15 DIAGNOSIS — F411 Generalized anxiety disorder: Secondary | ICD-10-CM | POA: Diagnosis not present

## 2024-06-15 NOTE — Progress Notes (Signed)
 Virtual Visit via Video Note   I connected with Sylvia Walter on 06/15/24 at 3:00pm by video enabled telemedicine application and verified that I am speaking with the correct person using two identifiers.   I discussed the limitations, risks, security and privacy concerns of performing an evaluation and management service by video and the availability of in person appointments. I also discussed with the patient that there may be a patient responsible charge related to this service. The patient expressed understanding and agreed to proceed.   I discussed the assessment and treatment plan with the patient. The patient was provided an opportunity to ask questions and all were answered. The patient agreed with the plan and demonstrated an understanding of the instructions.   The patient was advised to call back or seek an in-person evaluation if the symptoms worsen or if the condition fails to improve as anticipated.   I provided 1 hour of non-face-to-face time during this encounter.     Darleene Ricker, LCSW, LCAS ________________________________ THERAPIST PROGRESS NOTE   Session Time: 3:00pm - 4:00pm      Location: Patient: Patient home  Provider: [facility]   Participation Level: Active    Behavioral Response: Alert, casually dressed, anxious mood/affect   Type of Therapy:  Individual Therapy   Treatment Goals addressed: Depression/anxiety management; Medication compliance; Monitoring substance abuse   Progress Towards Goals: Progressing   Interventions: CBT, relapse prevention    Summary: Sylvia Walter is a 23 year old single AA female that presented for therapy appointment today with diagnoses of Generalized Anxiety Disorder; Major depressive disorder, recurrent, mild; PTSD and Alcohol Use Disorder, Mild.   Suicidal/Homicidal: None; without intent or plan.     Therapist Response:  Clinician met with Sylvia Walter for a virtual therapy session and assessed for safety, sobriety,  and medication compliance.  Sylvia Walter presented for appointment on time and was alert, oriented x5, with no evidence or self-report of active SI/HI or A/V H.  Sylvia Walter reported that she continues taking medication as prescribed and denied abuse of illicit substances.  She reported that she last drank alcohol on September 25th.  Clinician inquired about Sylvia Walter's emotional ratings today, as well as any significant changes in thoughts, feelings, or behavior since previous check-in.  Sylvia Walter reported scores of 3/10 for depression, 5/10 for anxiety, and 4/10 for anger/irritability.  Sylvia Walter denied any outbursts or panic attacks.  Sylvia Walter reported that a recent struggle was 'hitting rock bottom' and realizing the extent that her drinking was causing problems in her life.  Crista reported that she ended up talking to an ex about this issue, and he recommended that she open up more about this.  Sylvia Walter stated He was nice to me, and got me to realize that I wasn't having fun drinking.  I was getting sick.  It was to mask my feelings.  She reported that after calling a hotline, she attended 2 AA meetings and is more motivated to abstain. Clinician utilized relapse prevention handout on topic of justifications with Sylvia Walter to assist in curbing substance abuse patterns when faced with daily stressors.  Several common relapse justification scenarios were presented for consideration, including blaming someone else, catastrophic events, using for specific purposes, or self-medicating unpleasant emotions such as anxiety, anger, loneliness or fear.  Clinician explored healthier alternatives for coping with challenges like this via use of positive self-talk, exercise, or speaking with a sober peer for support.  Clinician also discussed refusal skills that could be implemented if offered  substances in the future by peers that drink around her.  Intervention was effective, as evidenced by Sylvia Walter actively engaging in  discussion on subject, reporting that several of these justifications were relatable and have caused issues for her in the past, including using alcohol for social settings where she might be uncomfortable, experiencing feelings of anxiety, anger, or sadness.  She stated "I think it makes me more social or outgoing.  The bad outweighs the good though".  Sylvia Walter reported that the person from the AA hotline she recently spoke with texted her during this session, and this reminded her that she does have the opportunity to increase sober support through engagement in recovery meetings and broadening her network. She reported that she would also be more consistent with therapy for greater accountability.  Clinician will continue to monitor.                     Plan: Follow up in 2 weeks.   Diagnosis: Generalized Anxiety Disorder; Major depressive disorder, recurrent, mild; PTSD and Alcohol Use Disorder, Mild.  Collaboration of Care:   None required at this time.                                                   Patient/Guardian was advised Release of Information must be obtained prior to any record release in order to collaborate their care with an outside provider. Patient/Guardian was advised if they have not already done so to contact the registration department to sign all necessary forms in order for us  to release information regarding their care.    Consent: Patient/Guardian gives verbal consent for treatment and assignment of benefits for services provided during this visit. Patient/Guardian expressed understanding and agreed to proceed.   Darleene Ricker, LCSW, LCAS 06/15/24

## 2024-06-16 ENCOUNTER — Ambulatory Visit
Admission: RE | Admit: 2024-06-16 | Discharge: 2024-06-16 | Disposition: A | Discharge status: Home / Self Care | Source: Ambulatory Visit | Attending: Surgery | Admitting: Surgery

## 2024-06-16 DIAGNOSIS — R222 Localized swelling, mass and lump, trunk: Secondary | ICD-10-CM

## 2024-06-16 MED ORDER — IOPAMIDOL (ISOVUE-300) INJECTION 61%
75.0000 mL | Freq: Once | INTRAVENOUS | Status: AC | PRN
Start: 2024-06-16 — End: 2024-06-16
  Administered 2024-06-16: 75 mL via INTRAVENOUS

## 2024-08-03 ENCOUNTER — Emergency Department (HOSPITAL_COMMUNITY)
Admission: EM | Admit: 2024-08-03 | Discharge: 2024-08-03 | Disposition: A | Discharge status: Home / Self Care | Attending: Emergency Medicine | Admitting: Emergency Medicine

## 2024-08-03 ENCOUNTER — Encounter (HOSPITAL_COMMUNITY): Payer: Self-pay

## 2024-08-03 ENCOUNTER — Emergency Department (HOSPITAL_COMMUNITY)

## 2024-08-03 ENCOUNTER — Other Ambulatory Visit: Payer: Self-pay

## 2024-08-03 DIAGNOSIS — R0789 Other chest pain: Secondary | ICD-10-CM | POA: Diagnosis present

## 2024-08-03 LAB — CBC
HCT: 39.3 % (ref 36.0–46.0)
Hemoglobin: 11.9 g/dL — ABNORMAL LOW (ref 12.0–15.0)
MCH: 23.4 pg — ABNORMAL LOW (ref 26.0–34.0)
MCHC: 30.3 g/dL (ref 30.0–36.0)
MCV: 77.4 fL — ABNORMAL LOW (ref 80.0–100.0)
Platelets: 260 K/uL (ref 150–400)
RBC: 5.08 MIL/uL (ref 3.87–5.11)
RDW: 15.7 % — ABNORMAL HIGH (ref 11.5–15.5)
WBC: 9.4 K/uL (ref 4.0–10.5)
nRBC: 0 % (ref 0.0–0.2)

## 2024-08-03 LAB — BASIC METABOLIC PANEL WITH GFR
Anion gap: 11 (ref 5–15)
BUN: 7 mg/dL (ref 6–20)
CO2: 20 mmol/L — ABNORMAL LOW (ref 22–32)
Calcium: 8.5 mg/dL — ABNORMAL LOW (ref 8.9–10.3)
Chloride: 106 mmol/L (ref 98–111)
Creatinine, Ser: 0.61 mg/dL (ref 0.44–1.00)
GFR, Estimated: >60 mL/min (ref >60)
Glucose, Bld: 89 mg/dL (ref 70–99)
Potassium: 3.8 mmol/L (ref 3.5–5.1)
Sodium: 137 mmol/L (ref 135–145)

## 2024-08-03 LAB — TROPONIN I (HIGH SENSITIVITY): Troponin I (High Sensitivity): <2 ng/L (ref <18)

## 2024-08-03 MED ORDER — KETOROLAC TROMETHAMINE 15 MG/ML IJ SOLN
15.0000 mg | Freq: Once | INTRAMUSCULAR | Status: AC
  Administered 2024-08-03: 15 mg via INTRAVENOUS
  Filled 2024-08-03: qty 1

## 2024-08-03 MED ORDER — METHOCARBAMOL 500 MG PO TABS: 500.0000 mg | ORAL_TABLET | Freq: Three times a day (TID) | ORAL | 0 refills | Status: AC | PRN

## 2024-08-03 NOTE — ED Provider Notes (Signed)
 [place] EMERGENCY DEPARTMENT AT [facility] Provider Note   CSN: [id number] Arrival date & time: 08/03/24  9097     Patient presents with: Chest Pain   Sylvia Walter is a 23 y.o. female.    Chest Pain Patient presents with chest pain.  Left anterior chest.  Began around 3 in the morning.  Did have lumpectomy however that was in May.  Pain worse with certain movements.  Not exertional.  No hypoxia.  No swelling in her legs.    Past Medical History:  Diagnosis Date   ADHD    Alpha thalassemia silent carrier 10/25/2021    Prior to Admission medications   Medication Sig Start Date End Date Taking? Authorizing Provider  methocarbamol  (ROBAXIN ) 500 MG tablet Take 1 tablet (500 mg total) by mouth every 8 (eight) hours as needed for muscle spasms. 08/03/24  Yes Patsey Lot, MD  oxyCODONE  (OXY IR/ROXICODONE ) 5 MG immediate release tablet Take 1 tablet (5 mg total) by mouth every 6 (six) hours as needed for moderate pain (pain score 4-6) or severe pain (pain score 7-10). 01/20/24   Vernetta Berg, MD  sertraline  (ZOLOFT ) 50 MG tablet Take 1 tablet (50 mg total) by mouth daily. 03/20/24 03/20/25  Ezzard Staci SAILOR, NP    Allergies: Patient has no known allergies.    Review of Systems  Cardiovascular:  Positive for chest pain.    Updated Vital Signs BP 118/72 (BP Location: Right Arm)   Pulse 83   Temp 98.1 F (36.7 C) (Oral)   Resp 14   Ht 5' 7 (1.702 m)   Wt 89.4 kg   LMP 07/01/2024   SpO2 100%   Breastfeeding No   BMI 30.85 kg/m   Physical Exam Vitals and nursing note reviewed.  Cardiovascular:     Rate and Rhythm: Regular rhythm.     Heart sounds: No murmur heard. Chest:     Chest wall: Tenderness present.     Comments: Tenderness to left upper anterior chest wall.  No rash. Musculoskeletal:     Right lower leg: No edema.     Left lower leg: No edema.  Neurological:     Mental Status: She is alert.     (all labs ordered are listed, but  only abnormal results are displayed) Labs Reviewed  BASIC METABOLIC PANEL WITH GFR - Abnormal; Notable for the following components:      Result Value   CO2 20 (*)    Calcium 8.5 (*)    All other components within normal limits  CBC - Abnormal; Notable for the following components:   Hemoglobin 11.9 (*)    MCV 77.4 (*)    MCH 23.4 (*)    RDW 15.7 (*)    All other components within normal limits  TROPONIN I (HIGH SENSITIVITY)  TROPONIN I (HIGH SENSITIVITY)    EKG: EKG Interpretation Date/Time:  Thursday August 03 2024 09:16:13 EST Ventricular Rate:  75 PR Interval:  156 QRS Duration:  83 QT Interval:  361 QTC Calculation: 404 R Axis:   100  Text Interpretation: Sinus rhythm Borderline right axis deviation Confirmed by Patsey Lot [id number]) on 08/03/2024 10:17:26 AM  Radiology: [facility] Chest 2 View Result Date: 08/03/2024 CLINICAL DATA:  Substernal chest pain beginning early this morning. EXAM: CHEST - 2 VIEW COMPARISON:  05/07/2024. FINDINGS: Normal heart, mediastinum and hila. Clear lungs.  No pleural effusion or pneumothorax. Skeletal structures are within normal limits. IMPRESSION: Normal chest radiographs. Electronically Signed  By: Alm Parkins M.D.   On: 08/03/2024 09:49     Procedures   Medications Ordered in the ED  ketorolac  (TORADOL ) 15 MG/ML injection 15 mg (15 mg Intravenous Given 08/03/24 1014)                                    Medical Decision Making Amount and/or Complexity of Data Reviewed Labs: ordered. Radiology: ordered.  Risk Prescription drug management.    patient with left anterior chest pain.  Differential diagnosis does include cardiac cause or PE but both felt less likely.  No hypoxia.  No swelling in her legs.  Reproducible pain.  EKG reassuring.  X-ray negative.  I think most likely chest wall pain.  Appears stable for discharge home.  Symptomatic treatment.     Final diagnoses:  Chest wall pain    ED Discharge Orders           Ordered    methocarbamol  (ROBAXIN ) 500 MG tablet  Every 8 hours PRN        08/03/24 1023               Patsey Lot, MD 08/04/24 [id number]

## 2024-08-03 NOTE — ED Triage Notes (Signed)
 Pt had lumpectomy May 15th.C/O substernal CP that started 3am. States it hurts to breathe in. Axox4.

## 2024-08-03 NOTE — ED Notes (Signed)
 Patient transported to X-ray

## 2024-08-08 ENCOUNTER — Telehealth (HOSPITAL_COMMUNITY): Admitting: Family

## 2024-08-08 DIAGNOSIS — F330 Major depressive disorder, recurrent, mild: Secondary | ICD-10-CM

## 2024-08-08 DIAGNOSIS — F4310 Post-traumatic stress disorder, unspecified: Secondary | ICD-10-CM

## 2024-08-08 MED ORDER — ATOMOXETINE HCL 25 MG PO CAPS: 25.0000 mg | ORAL_CAPSULE | Freq: Every day | ORAL | 1 refills | Status: DC

## 2024-08-08 MED ORDER — SERTRALINE HCL 50 MG PO TABS: 50.0000 mg | ORAL_TABLET | Freq: Every day | ORAL | 0 refills | Status: DC

## 2024-08-08 NOTE — Progress Notes (Signed)
 Virtual Visit via Video Note  I connected with Sylvia Walter on 08/08/24 at  4:30 PM EST by a video enabled telemedicine application and verified that I am speaking with the correct person using two identifiers.  Location: Patient: Home Provider: Office    I discussed the limitations of evaluation and management by telemedicine and the availability of in person appointments. The patient expressed understanding and agreed to proceed.    I discussed the assessment and treatment plan with the patient. The patient was provided an opportunity to ask questions and all were answered. The patient agreed with the plan and demonstrated an understanding of the instructions.   The patient was advised to call back or seek an in-person evaluation if the symptoms worsen or if the condition fails to improve as anticipated.  I provided 12 minutes of non-face-to-face time during this encounter.   Sylvia LOISE Kerns, NP   [facility] MD/PA/NP OP Progress Note  08/12/2024 9:50 AM Sylvia Walter  MRN:  [id number]  Chief Complaint: Medication management   HPI:  Sylvia Walter 23 year old African-American female who presents for medication management follow-up appointment.  Carries diagnoses related to adjustment disorder mixed anxiety and depressed mood.  Currently prescribed Zoloft  50 mg daily which she reports she has been taking and tolerating well.  Reports she was previously prescribed ADHD medication prior to pregnancy.  Stated she stopped medication for pregnancy and breast-feeding reasons and would like to be restarted on ADHD medications.  Reports she has previously followed by [facility] where she was prescribed Abilify and she believes Adderall but is unsure of the medication.  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  Sylvia Walter was provided education with ADHD adult testing.  Reports she was followed by [facility] [name] and/or [facility] in [place].  Requested previous  medical records she was amendable to plan.  Discussed initiating Strattera  for poor concentration and focus at this visit until she is able to follow-up with med records and/or retesting for ADHD.  She was amendable to plan.   Visit Diagnosis:    ICD-10-CM   1. Major depressive disorder, recurrent episode, mild  F33.0     2. PTSD (post-traumatic stress disorder)  F43.10       Past Psychiatric History: H/O major depressive disorder, generalized anxiety disorder reported obsessive-compulsive disorder and attention deficit disorder.  Previously followed by [facility] and [facility] unable to recall medications previously prescribed.  Past Medical History:  Past Medical History:  Diagnosis Date   ADHD    Alpha thalassemia silent carrier 10/25/2021    Past Surgical History:  Procedure Laterality Date   BREAST BIOPSY Left 12/08/2023   US  LT BREAST BX W LOC DEV 1ST LESION IMG BX SPEC US  GUIDE 12/08/2023 [facility]AMMOGRAPHY   BREAST LUMPECTOMY Left 01/20/2024   Procedure: BREAST LUMPECTOMY;  Surgeon: Vernetta Berg, MD;  Location: [facility];  Service: General;  Laterality: Left;  LMA   ROOT CANAL Left 07/31/2020   VAGINAL DELIVERY N/A 03/18/2022   Procedure: VAGINAL DELIVERY multiple gestation;  Surgeon: Herchel Gloris LABOR, MD;  Location: [facility];  Service: Obstetrics;  Laterality: N/A;    Family Psychiatric History:   Family History:  Family History  Problem Relation Age of Onset   Hypertension Mother    BRCA 1/2 Mother        BRCA2 positive   BRCA 1/2 Maternal Aunt        BRCA2 positive  Prostate cancer Maternal Grandfather 71   Cancer Other        breast cancer in MGM's two sisters (~ age 30); pancreatic cancer in MGM's mother (3s); myeloma in MGM's father   Prostate cancer Other        MGF's brother and father (mets); dx 62s-60s   Lung cancer Other        MGF's brother; d. 71   Ovarian cancer Other        PGM's sister    Social History:   Social History   Socioeconomic History   Marital status: Single    Spouse name: Not on file   Number of children: Not on file   Years of education: Not on file   Highest education level: Not on file  Occupational History   Not on file  Tobacco Use   Smoking status: Never   Smokeless tobacco: Never  Vaping Use   Vaping status: Never Used  Substance and Sexual Activity   Alcohol use: Not Currently   Drug use: Never   Sexual activity: Not Currently    Partners: Male    Birth control/protection: None  Other Topics Concern   Not on file  Social History Narrative   Not on file   Social Drivers of Health   Financial Resource Strain: Not on file  Food Insecurity: Not at Risk (02/04/2024)   Received from [employer] Insecurity    Within the past 12 months, the food you bought just didn't last and you didn't have enough money to get more.: 1  Transportation Needs: Not at Risk (02/04/2024)   Received from [employer] Needs    In the past 12 months, has lack of transportation kept you from medical appointments, meetings, work or from getting things needed for daily living? (Check all that apply): 1  Physical Activity: Not on file  Stress: Not on file  Social Connections: Not on file    Allergies: No Known Allergies  Metabolic Disorder Labs: Lab Results  Component Value Date   HGBA1C 5.8 (H) 04/20/2023   No results found for: PROLACTIN No results found for: CHOL, TRIG, HDL, CHOLHDL, VLDL, LDLCALC Lab Results  Component Value Date   TSH 1.290 04/20/2023    Therapeutic Level Labs: No results found for: LITHIUM No results found for: VALPROATE No results found for: CBMZ  Current Medications: Current Outpatient Medications  Medication Sig Dispense Refill   atomoxetine  (STRATTERA ) 25 MG capsule Take 1 capsule (25 mg total) by mouth daily. 30 capsule 1   methocarbamol  (ROBAXIN ) 500 MG tablet Take 1 tablet (500 mg total) by mouth every 8  (eight) hours as needed for muscle spasms. 8 tablet 0   oxyCODONE  (OXY IR/ROXICODONE ) 5 MG immediate release tablet Take 1 tablet (5 mg total) by mouth every 6 (six) hours as needed for moderate pain (pain score 4-6) or severe pain (pain score 7-10). 20 tablet 0   sertraline  (ZOLOFT ) 50 MG tablet Take 1 tablet (50 mg total) by mouth daily. 90 tablet 0   No current facility-administered medications for this visit.     Musculoskeletal: Virtual   Psychiatric Specialty Exam: Review of Systems  Last menstrual period 07/01/2024.There is no height or weight on file to calculate BMI.  General Appearance: Casual  Eye Contact:  Good  Speech:  Clear and Coherent  Volume:  Normal  Mood:  Anxious and Depressed  Affect:  Congruent  Thought Process:  Coherent  Orientation:  Full (Time,  Place, and Person)  Thought Content: Logical   Suicidal Thoughts:  No  Homicidal Thoughts:  No  Memory:  Immediate;   Good Recent;   Good  Judgement:  Good  Insight:  Good  Psychomotor Activity:  Normal  Concentration:  Concentration: Poor  Recall:  Good  Fund of Knowledge: Good  Language: Good  Akathisia:  No  Handed:  Right  AIMS (if indicated): not done  Assets:  Communication Skills Desire for Improvement  ADL's:  Intact  Cognition: WNL  Sleep:  Good   Screenings: AUDIT    Advertising Copywriter from 11/10/2023 in [facility]  Alcohol Use Disorder Identification Test Final Score (AUDIT) 11   CAGE-AID    Flowsheet Row Counselor from 11/10/2023 in [facility] at [place]  CAGE-AID Score 3   GAD-7    Flowsheet Row Counselor from 09/22/2023 in [facility] at [facility] Visit from 04/20/2023 in [facility] for [facility] Routine Prenatal from 01/30/2022 in [facility] for [facility] at [place] Clinical Support from 09/29/2021 in [facility] for  [facility] at [place]  Total GAD-7 Score 20 11 0 3   PHQ2-9    Flowsheet Row Counselor from 09/22/2023 in [facility] at [facility] Visit from 09/13/2023 in [facility] Office Visit from 04/20/2023 in [facility] for [facility] at [place] Nutrition from 02/18/2022 in [facility]  - [facility] Of [place] Routine Prenatal from 01/30/2022 in [facility] for [facility] at [facility] Total Score 2 3 2  0 0  PHQ-9 Total Score 19 18 9  -- 1   Flowsheet Row ED from 08/03/2024 in [facility] at [facility] ED from 05/07/2024 in [facility] Department at [facility] ED from 02/09/2024 in [facility] Emergency Department at [facility]  C-SSRS RISK CATEGORY No Risk No Risk No Risk     Assessment and Plan: Amaranta Mehl 23 year old African-American female presents for medication management follow-up appointment.  Seen and evaluated via virtual platform.  Currently prescribed Zoloft  which she reports she has been taking and tolerating well.  States she has since discontinued breast-feeding and feels that her mood is stabilized.  Patient inquired about initiating medication for concentration and focus.  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  States she was previously diagnosed with attention deficit disorder and would like to restart previous medications.  Discussed following up with previous mental health records from [facility] and/or [name].  She was amendable to plan.  Discussed initiating Strattera  to current medication regimen.  Provided with additional outpatient resources for adult ADHD testing.  Support encouragement reassurance was provided  Collaboration of Care: Collaboration of Care: Medication Management AEB initiated Strattera  25 mg daily  Patient/Guardian was  advised Release of Information must be obtained prior to any record release in order to collaborate their care with an outside provider. Patient/Guardian was advised if they have not already done so to contact the registration department to sign all necessary forms in order for us  to release information regarding their care.   Consent: Patient/Guardian gives verbal consent for treatment and assignment of benefits for services provided during this visit. Patient/Guardian expressed understanding and agreed to proceed.    Sylvia LOISE Kerns, NP 08/08/2024, 9:50 AM

## 2024-08-09 ENCOUNTER — Ambulatory Visit (INDEPENDENT_AMBULATORY_CARE_PROVIDER_SITE_OTHER): Admitting: Licensed Clinical Social Worker

## 2024-08-09 DIAGNOSIS — F330 Major depressive disorder, recurrent, mild: Secondary | ICD-10-CM

## 2024-08-09 DIAGNOSIS — F411 Generalized anxiety disorder: Secondary | ICD-10-CM

## 2024-08-09 DIAGNOSIS — F1010 Alcohol abuse, uncomplicated: Secondary | ICD-10-CM

## 2024-08-09 DIAGNOSIS — F4310 Post-traumatic stress disorder, unspecified: Secondary | ICD-10-CM | POA: Diagnosis not present
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
## 2024-08-21 ENCOUNTER — Ambulatory Visit (INDEPENDENT_AMBULATORY_CARE_PROVIDER_SITE_OTHER): Admitting: Licensed Clinical Social Worker

## 2024-08-21 DIAGNOSIS — F1010 Alcohol abuse, uncomplicated: Secondary | ICD-10-CM | POA: Diagnosis not present

## 2024-08-21 DIAGNOSIS — F4310 Post-traumatic stress disorder, unspecified: Secondary | ICD-10-CM

## 2024-08-21 DIAGNOSIS — F411 Generalized anxiety disorder: Secondary | ICD-10-CM

## 2024-08-21 DIAGNOSIS — F330 Major depressive disorder, recurrent, mild: Secondary | ICD-10-CM | POA: Diagnosis not present

## 2024-08-21 NOTE — Progress Notes (Signed)
 Virtual Visit via Video Note   I connected with Sylvia Walter on 08/21/24 at 3:14pm by video enabled telemedicine application and verified that I am speaking with the correct person using two identifiers.   I discussed the limitations, risks, security and privacy concerns of performing an evaluation and management service by video and the availability of in person appointments. I also discussed with the patient that there may be a patient responsible charge related to this service. The patient expressed understanding and agreed to proceed.   I discussed the assessment and treatment plan with the patient. The patient was provided an opportunity to ask questions and all were answered. The patient agreed with the plan and demonstrated an understanding of the instructions.   The patient was advised to call back or seek an in-person evaluation if the symptoms worsen or if the condition fails to improve as anticipated.   I provided 46 minutes of non-face-to-face time during this encounter.     Sylvia Ricker, LCSW, LCAS ________________________________ THERAPIST PROGRESS NOTE   Session Time: 3:14pm - 4:00pm   Location: Patient: Patient car (parked)   Provider: Home Office   Participation Level: Active    Behavioral Response: Alert, casually dressed, anxious mood/affect   Type of Therapy:  Individual Therapy   Treatment Goals addressed: Depression/anxiety management; Medication compliance; Monitoring substance abuse  Progress Towards Goals: Progressing   Interventions: CBT, relapse prevention    Summary: Sylvia Walter is a 23 year old single AA female that presented for therapy appointment today with diagnoses of Generalized Anxiety Disorder; Major depressive disorder, recurrent, mild; PTSD and Alcohol Use Disorder, Mild.   Suicidal/Homicidal: None; without intent or plan.     Therapist Response:  Clinician met with Sylvia Walter for a virtual therapy session and assessed for safety,  sobriety, and medication compliance.  Sylvia Walter presented for appointment 14 minutes late, reporting that she had an exam in class today and this held her up.  Clinician reminded her of attendance policy, and cutoff time of 15 minutes after start of session leading to no-show charge.  She was alert, oriented x5, with no evidence or self-report of active SI/HI or A/V H.  Sylvia Walter reported that she continues taking medication as prescribed and denied abuse of illicit substances.  Clinician inquired about Sylvia Walter's emotional ratings today, as well as any significant changes in thoughts, feelings, or behavior since previous check-in.  Sylvia Walter reported scores of 1/10 for depression, 5/10 for anxiety, and 0/10 for anger/irritability.  Sylvia Walter denied any recent outbursts or panic attacks.  Sylvia Walter reported that a struggle has been maintaining sobriety, as she drank twice since our last session, both on weekends with friends at bars.  She reported that on one event, she limited herself to 2 beers, but on the next outing she had roughly 6 shots, and regretted it.  Clinician virtually shared a CBT handout with Sylvia Walter to assist.  This handout was utilized to guide reflection on recent events which triggered this drinking event, and alternative approaches that could have been implemented early on to prevent relapse.  This handout explained how the cognitive triad model could be applied to difficult situations in order to demonstrate the relationship between one's thoughts, emotions, and behaviors, with emphasis placed on changing one's thinking to alleviate unpleasant emotions and limit maladaptive behaviors such as alcohol use.  Intervention was effective, as evidenced by Sylvia Walter actively engaging in discussion on subject, and finding this handout to be helpful for building insight into recent triggers which  fueled desire to drink.  She reported that after the first drinking event didn't lead to any issues, she felt more  confident.  Sylvia Walter reported that in the second social event she was enjoying herself and avoiding alcohol consumption, but when they went to the next bar she started to think she couldn't have as much fun dancing and socializing, which made her feel lonely, and eventually give in and drink more than planned, which led to an uncomfortable station later.  Sylvia Walter reported that she realized how certain social settings can be a trigger, and importance of spending more time with sober peers if she intends to limit consequences of drinking episodes. Clinician will continue to monitor.                     Plan: Follow up in 2 weeks.   Diagnosis: Generalized Anxiety Disorder; Major depressive disorder, recurrent, mild; PTSD and Alcohol Use Disorder, Mild.  Collaboration of Care:   None required at this time.                                                   Patient/Guardian was advised Release of Information must be obtained prior to any record release in order to collaborate their care with an outside provider. Patient/Guardian was advised if they have not already done so to contact the registration department to sign all necessary forms in order for us  to release information regarding their care.    Consent: Patient/Guardian gives verbal consent for treatment and assignment of benefits for services provided during this visit. Patient/Guardian expressed understanding and agreed to proceed.   Sylvia Ricker, LCSW, LCAS 08/21/24

## 2024-08-30 ENCOUNTER — Other Ambulatory Visit (HOSPITAL_COMMUNITY): Payer: Self-pay | Admitting: Family

## 2024-09-05 ENCOUNTER — Ambulatory Visit (INDEPENDENT_AMBULATORY_CARE_PROVIDER_SITE_OTHER): Admitting: Licensed Clinical Social Worker

## 2024-09-05 DIAGNOSIS — F1010 Alcohol abuse, uncomplicated: Secondary | ICD-10-CM | POA: Diagnosis not present

## 2024-09-05 DIAGNOSIS — F4310 Post-traumatic stress disorder, unspecified: Secondary | ICD-10-CM | POA: Diagnosis not present

## 2024-09-05 DIAGNOSIS — F330 Major depressive disorder, recurrent, mild: Secondary | ICD-10-CM | POA: Diagnosis not present

## 2024-09-05 DIAGNOSIS — F411 Generalized anxiety disorder: Secondary | ICD-10-CM | POA: Diagnosis not present

## 2024-09-05 NOTE — Progress Notes (Signed)
 Virtual Visit via Video Note   I connected with Sylvia Walter on 09/05/24 at 3:00pm by video enabled telemedicine application and verified that I am speaking with the correct person using two identifiers.   I discussed the limitations, risks, security and privacy concerns of performing an evaluation and management service by video and the availability of in person appointments. I also discussed with the patient that there may be a patient responsible charge related to this service. The patient expressed understanding and agreed to proceed.   I discussed the assessment and treatment plan with the patient. The patient was provided an opportunity to ask questions and all were answered. The patient agreed with the plan and demonstrated an understanding of the instructions.   The patient was advised to call back or seek an in-person evaluation if the symptoms worsen or if the condition fails to improve as anticipated.   I provided 1 hour of non-face-to-face time during this encounter.     Darleene Ricker, LCSW, LCAS ________________________________ THERAPIST PROGRESS NOTE   Session Time: 3:00pm - 4:00pm   Location: Patient: Patient home   Provider: [facility]   Participation Level: Active    Behavioral Response: Alert, casually dressed, anxious mood/affect   Type of Therapy:  Individual Therapy   Treatment Goals addressed: Depression/anxiety management; Medication compliance; Monitoring substance abuse; Maintaining healthy boundaries with network   Progress Towards Goals: Progressing  Interventions: CBT, conflict resolution    Summary: Sylvia Walter is a 23 year old single AA female that presented for therapy appointment today with diagnoses of Generalized Anxiety Disorder; Major depressive disorder, recurrent, mild; PTSD and Alcohol Use Disorder, Mild.   Suicidal/Homicidal: None; without intent or plan.   Therapist Response:  Clinician met with Sylvia Walter for a virtual therapy  appointment and assessed for safety, sobriety, and medication compliance.  Sylvia Walter presented for appointment on time and was alert, oriented x5, with no evidence or self-report of active SI/HI or A/V H.  Sylvia Walter reported that she continues taking medication as prescribed and denied abuse of illicit substances.  She reported that she has drank alcohol on two occasions since our last session, including having 2 glasses of wine, and then 4-5 shots of liquor on another night while out with friends.  Clinician encouraged Deshara to be mindful of consequences of drinking and try to explore healthier alternatives for stress relief.  Clinician inquired about Sylvia Walter's current emotional ratings, as well as any significant changes in thoughts, feelings, or behavior since last check-in.  Sylvia Walter reported scores of 3/10 for depression, 4/10 for anxiety, and 0/10 for anger/irritability.  Sylvia Walter denied any recent outbursts or panic attacks.  Sylvia Walter reported that a struggle has been trying to manage conflict with family, especially over the holidays, as she and her mother have been having frequent disagreements.  Clinician covered topic of conflict resolution today and virtually shared a handout on subject with Sylvia Walter which warned against the 'four horsemen' of communication traps that should be avoided due to tendency to escalate and damage a relationship.  These included criticism, defensiveness, contempt, and stonewalling.  'Antidotes' to these harmful behaviors were offered as healthy replacements to improve communication and understanding, including approaching problems with a gentle startup approach, taking responsibility for one's behavior, sharing fondness/admiration, and using self-soothing to calm down and focus on the problem at hand.  Clinician encouraged Sylvia Walter to apply this material to recent experiences with conflict that she has faced, consider which approach she utilized, and any changes that  she  could plan to implement in order to improve overall conflict resolution skills.  Intervention was effective, as evidenced by Sylvia Walter actively participating in discussion on topic, reporting that she has engaged in some of these communication traps before, including criticism, and stonewalling.  Sylvia Walter reported that this has led to consequences such as getting away from the problems at hand in the relationships, and causing her to repress feelings.  Sylvia Walter expressed receptiveness to alternative strategies offered in order to improve conflict resolution skills, including being more assertive with key supports like her mother, using relaxation skills to stay calm when triggered, and taking timeouts when needed rather than shutting down or having outbursts.  Clinician will continue to monitor.                 Plan: Follow up in 2 weeks.   Diagnosis: Generalized Anxiety Disorder; Major depressive disorder, recurrent, mild; PTSD and Alcohol Use Disorder, Mild.  Collaboration of Care:   None required at this time.                                                   Patient/Guardian was advised Release of Information must be obtained prior to any record release in order to collaborate their care with an outside provider. Patient/Guardian was advised if they have not already done so to contact the registration department to sign all necessary forms in order for us  to release information regarding their care.    Consent: Patient/Guardian gives verbal consent for treatment and assignment of benefits for services provided during this visit. Patient/Guardian expressed understanding and agreed to proceed.   Darleene Ricker, LCSW, LCAS 09/05/24

## 2024-09-18 ENCOUNTER — Ambulatory Visit (HOSPITAL_COMMUNITY): Admitting: Licensed Clinical Social Worker

## 2024-09-26 ENCOUNTER — Ambulatory Visit (INDEPENDENT_AMBULATORY_CARE_PROVIDER_SITE_OTHER): Admitting: Licensed Clinical Social Worker

## 2024-09-26 DIAGNOSIS — F411 Generalized anxiety disorder: Secondary | ICD-10-CM | POA: Diagnosis not present

## 2024-09-26 DIAGNOSIS — F330 Major depressive disorder, recurrent, mild: Secondary | ICD-10-CM

## 2024-09-26 DIAGNOSIS — F4310 Post-traumatic stress disorder, unspecified: Secondary | ICD-10-CM

## 2024-09-26 DIAGNOSIS — F1010 Alcohol abuse, uncomplicated: Secondary | ICD-10-CM

## 2024-09-26 NOTE — Progress Notes (Signed)
 Virtual Visit via Video Note   I connected with Sylvia Walter on 09/26/24 at 2:00pm by video enabled telemedicine application and verified that I am speaking with the correct person using two identifiers.   I discussed the limitations, risks, security and privacy concerns of performing an evaluation and management service by video and the availability of in person appointments. I also discussed with the patient that there may be a patient responsible charge related to this service. The patient expressed understanding and agreed to proceed.   I discussed the assessment and treatment plan with the patient. The patient was provided an opportunity to ask questions and all were answered. The patient agreed with the plan and demonstrated an understanding of the instructions.   The patient was advised to call back or seek an in-person evaluation if the symptoms worsen or if the condition fails to improve as anticipated.   I provided 1 hour of non-face-to-face time during this encounter.     Darleene Ricker, LCSW, LCAS ________________________________ THERAPIST PROGRESS NOTE   Session Time: 2:00pm - 3:00pm   Location: Patient: Patient home   Provider: [facility]   Participation Level: Active    Behavioral Response: Alert, casually dressed, anxious mood/affect   Type of Therapy:  Individual Therapy   Treatment Goals addressed: Depression/anxiety management; Medication compliance; Monitoring substance abuse; Maintaining healthy boundaries with network   Progress Towards Goals: Progressing    Interventions: CBT, supportive therapy    Summary: Sylvia Walter is a 24 year old single AA female that presented for therapy appointment today with diagnoses of Generalized Anxiety Disorder; Major depressive disorder, recurrent, mild; PTSD and Alcohol Use Disorder, Mild.   Suicidal/Homicidal: None; without intent or plan.     Therapist Response:  Clinician met with Sylvia Walter for a virtual  therapy session and assessed for safety, sobriety, and medication compliance.  Sylvia Walter presented for appointment on time and was alert, oriented x5, with no evidence or self-report of active SI/HI or A/V H.  Sylvia Walter reported that she continues taking medication as prescribed and denied abuse of illicit substances.  She reported that she has drank alcohol once since our last session, and this involved splitting a beer with a friend while socializing at home yesterday.  Clinician encouraged Sylvia Walter to be mindful of consequences of drinking and try to explore healthier alternatives for stress relief.  Clinician inquired about Sylvia Walter's emotional ratings today, as well as any significant changes in thoughts, feelings, or behavior since previous check-in.  Sylvia Walter reported scores of 2/10 for depression, 4/10 for anxiety, and 1/10 for anger/irritability.  Anyi denied any recent panic attacks.  Sylvia Walter reported that a success was going on a date with an old friend yesterday, which went well, stating We have similar views and values.  She reported that a struggle was having an outburst toward a friend when she felt like she was being ignored, and devalued.  Clinician revisited psychoeducation on traits of healthy relationships using a handout to guide discussion with Sylvia Walter on the subject.  This handout explained how attachments to connections within one's support network (i.e. friends, family, romantic partners, coworkers, catering manager) can influence one's mental health, and emphasized importance of looking for green lights (positive behaviors) that can be considered normal, as well as red lights (harmful behaviors) which suggest that a boundary should be established. Sylvia Walter was tasked with identifying green lights (i.e. respect, trust, appreciation, healthy conflict resolution, etc) and red lights (i.e. contempt, suspicion, impatience, lack of growth, etc) that  have been present within this friendship in order to  help her determine what kind of boundary needs to be set to reduce outbursts and negative impact upon mental health.  Intervention was effective, as evidenced by Sylvia Walter actively participating in discussion on subject, and identifying several unhealthy traits that her friend displayed recently in the relationship, such gaslighting, poor conflict resolution, and dishonesty.  Sylvia Walter reported that in the future, it would be better for her to be more honest and communicate her feelings to friends to avoid letting things bottle up, and reach a breaking point.  Sylvia Walter reported that she also recognized how her behavior could be perceived as a red flag, raise suspicion and cause friction.  She reported that due to the mutual issues that have arisen between her and this friend, it will likely be better if they no longer spend time together.  Clinician will continue to monitor.                  Plan: Follow up in 2 weeks.   Diagnosis: Generalized Anxiety Disorder; Major depressive disorder, recurrent, mild; PTSD and Alcohol Use Disorder, Mild.  Collaboration of Care:   None required at this time.                                                   Patient/Guardian was advised Release of Information must be obtained prior to any record release in order to collaborate their care with an outside provider. Patient/Guardian was advised if they have not already done so to contact the registration department to sign all necessary forms in order for us  to release information regarding their care.    Consent: Patient/Guardian gives verbal consent for treatment and assignment of benefits for services provided during this visit. Patient/Guardian expressed understanding and agreed to proceed.   Darleene Ricker, LCSW, LCAS 09/26/24

## 2024-10-02 ENCOUNTER — Ambulatory Visit (INDEPENDENT_AMBULATORY_CARE_PROVIDER_SITE_OTHER): Admitting: Licensed Clinical Social Worker

## 2024-10-02 DIAGNOSIS — F4310 Post-traumatic stress disorder, unspecified: Secondary | ICD-10-CM | POA: Diagnosis not present

## 2024-10-02 DIAGNOSIS — F331 Major depressive disorder, recurrent, moderate: Secondary | ICD-10-CM | POA: Diagnosis not present

## 2024-10-02 DIAGNOSIS — F603 Borderline personality disorder: Secondary | ICD-10-CM | POA: Diagnosis not present

## 2024-10-02 DIAGNOSIS — F411 Generalized anxiety disorder: Secondary | ICD-10-CM | POA: Diagnosis not present

## 2024-10-02 DIAGNOSIS — F1010 Alcohol abuse, uncomplicated: Secondary | ICD-10-CM
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
## 2024-10-10 ENCOUNTER — Telehealth (HOSPITAL_COMMUNITY): Admitting: Family

## 2024-10-10 DIAGNOSIS — F411 Generalized anxiety disorder: Secondary | ICD-10-CM

## 2024-10-10 DIAGNOSIS — F331 Major depressive disorder, recurrent, moderate: Secondary | ICD-10-CM

## 2024-10-10 DIAGNOSIS — R41840 Attention and concentration deficit: Secondary | ICD-10-CM | POA: Diagnosis not present

## 2024-10-10 MED ORDER — SERTRALINE HCL 50 MG PO TABS: 50.0000 mg | ORAL_TABLET | Freq: Every day | ORAL | 0 refills | Status: AC

## 2024-10-10 MED ORDER — ATOMOXETINE HCL 40 MG PO CAPS: 40.0000 mg | ORAL_CAPSULE | Freq: Every day | ORAL | 0 refills | Status: AC

## 2024-10-10 NOTE — Progress Notes (Signed)
 " Virtual Visit via Video Note  I connected with Sylvia Walter on 10/10/24 at  4:00 PM EST by a video enabled telemedicine application and verified that I am speaking with the correct person using two identifiers.  Location: Patient: Home Provider: Office    I discussed the limitations of evaluation and management by telemedicine and the availability of in person appointments. The patient expressed understanding and agreed to proceed.  History of Present Illness:    I discussed the assessment and treatment plan with the patient. The patient was provided an opportunity to ask questions and all were answered. The patient agreed with the plan and demonstrated an understanding of the instructions.   The patient was advised to call back or seek an in-person evaluation if the symptoms worsen or if the condition fails to improve as anticipated.  I provided 10 minutes of non-face-to-face time during this encounter.   Sylvia LOISE Kerns, NP   [facility] MD/PA/NP OP Progress Note  10/10/2024 7:10 PM Sylvia Walter  MRN:  [id number]  Chief Complaint: Medication management  Sylvia Walter 24 year old African-American female presents for medication management follow-up appointment.  She carries a diagnosis related to major depressive disorder, generalized anxiety disorder, [name] cites poor concentration and focus states she was [facility] [place] awaiting for [facility] records.  Previously initiated on Strattera  25 mg daily which she reports she has been taking and tolerating well.  States she feels medication has helped her symptoms.  Patient inquired about increasing Strattera  to 40 mg daily.  States she has been doing a lot of self-care to include working out at [employer]  No concerns related to sleep disturbance.  Reports a fair appetite.  Will continue to monitor symptoms.  Patient to follow-up 3 months for medication adherence/tolerability.  Support, encouragement  and reassurance was provided.   HPI:  Visit Diagnosis:    ICD-10-CM   1. Major depressive disorder, recurrent episode, moderate (HCC)  F33.1     2. Generalized anxiety disorder  F41.1     3. Poor concentration  R41.840       Past Psychiatric History: Major depressive disorder, postpartum depression, generalized anxiety, borderline personality disorder .disorder self-reported history related to attention deficit disorder.     Past Medical History:  Past Medical History:  Diagnosis Date   ADHD    Alpha thalassemia silent carrier 10/25/2021    Past Surgical History:  Procedure Laterality Date   BREAST BIOPSY Left 12/08/2023   US  LT BREAST BX W LOC DEV 1ST LESION IMG BX SPEC US  GUIDE 12/08/2023 [facility]MO[facility]   BREAST LUMPECTOMY Left 01/20/2024   Procedure: BREAST LUMPECTOMY;  Surgeon: Vernetta Berg, MD;  Location: [facility];  Service: General;  Laterality: Left;  LMA   ROOT CANAL Left 07/31/2020   VAGINAL DELIVERY N/A 03/18/2022   Procedure: VAGINAL DELIVERY multiple gestation;  Surgeon: Herchel Gloris LABOR, MD;  Location: [facility];  Service: Obstetrics;  Laterality: N/A;    Family Psychiatric History:   Family History:  Family History  Problem Relation Age of Onset   Hypertension Mother    BRCA 1/2 Mother        BRCA2 positive   BRCA 1/2 Maternal Aunt        BRCA2 positive   Prostate cancer Maternal Grandfather 35   Cancer Other        breast cancer in MGM's two sisters (~ age 70); pancreatic cancer in MGM's mother (48s); myeloma in  MGM's father   Prostate cancer Other        MGF's brother and father (mets); dx 55s-60s   Lung cancer Other        MGF's brother; d. 66   Ovarian cancer Other        PGM's sister    Social History:  Social History   Socioeconomic History   Marital status: Single    Spouse name: Not on file   Number of children: Not on file   Years of education: Not on file   Highest education level: Not on file   Occupational History   Not on file  Tobacco Use   Smoking status: Never   Smokeless tobacco: Never  Vaping Use   Vaping status: Never Used  Substance and Sexual Activity   Alcohol use: Not Currently   Drug use: Never   Sexual activity: Not Currently    Partners: Male    Birth control/protection: None  Other Topics Concern   Not on file  Social History Narrative   Not on file   Social Drivers of Health   Tobacco Use: Low Risk (08/03/2024)   Patient History    Smoking Tobacco Use: Never    Smokeless Tobacco Use: Never    Passive Exposure: Not on file  Financial Resource Strain: Not on file  Food Insecurity: Not at Risk (02/04/2024)   Received from [employer] Insecurity    Within the past 12 months, the food you bought just didn't last and you didn't have enough money to get more.: 1  Transportation Needs: Not at Risk (02/04/2024)   Received from [employer] Needs    In the past 12 months, has lack of transportation kept you from medical appointments, meetings, work or from getting things needed for daily living? (Check all that apply): 1  Physical Activity: Not on file  Stress: Not on file  Social Connections: Not on file  Depression (EYV7-0): High Risk (10/02/2024)   Depression (PHQ2-9)    PHQ-2 Score: 16  Alcohol Screen: Medium Risk (11/10/2023)   Alcohol Screen    Last Alcohol Screening Score (AUDIT): 11  Housing: Not on file  Utilities: Not on file  Health Literacy: Not on file    Allergies: Allergies[1]  Metabolic Disorder Labs: Lab Results  Component Value Date   HGBA1C 5.8 (H) 04/20/2023   No results found for: PROLACTIN No results found for: CHOL, TRIG, HDL, CHOLHDL, VLDL, LDLCALC Lab Results  Component Value Date   TSH 1.290 04/20/2023    Therapeutic Level Labs: No results found for: LITHIUM No results found for: VALPROATE No results found for: CBMZ  Current Medications: Current Outpatient Medications   Medication Sig Dispense Refill   atomoxetine  (STRATTERA ) 40 MG capsule Take 1 capsule (40 mg total) by mouth daily. 60 capsule 0   methocarbamol  (ROBAXIN ) 500 MG tablet Take 1 tablet (500 mg total) by mouth every 8 (eight) hours as needed for muscle spasms. 8 tablet 0   oxyCODONE  (OXY IR/ROXICODONE ) 5 MG immediate release tablet Take 1 tablet (5 mg total) by mouth every 6 (six) hours as needed for moderate pain (pain score 4-6) or severe pain (pain score 7-10). 20 tablet 0   sertraline  (ZOLOFT ) 50 MG tablet Take 1 tablet (50 mg total) by mouth daily. 90 tablet 0   No current facility-administered medications for this visit.     Musculoskeletal: Virtual  assessment   Psychiatric Specialty Exam: Review of Systems  There were no  vitals taken for this visit.There is no height or weight on file to calculate BMI.  General Appearance: Casual  Eye Contact:  Good  Speech:  Clear and Coherent  Volume:  Normal  Mood:  Euthymic  Affect:  Congruent  Thought Process:  Coherent  Orientation:  Full (Time, Place, and Person)  Thought Content: Logical   Suicidal Thoughts:  No  Homicidal Thoughts:  No  Memory:  Immediate;   Good Recent;   Good  Judgement:  Good  Insight:  Good  Psychomotor Activity:  Normal  Concentration:  Concentration: Good  Recall:  Good  Fund of Knowledge: Good  Language: Good  Akathisia:  No  Handed:  Right  AIMS (if indicated): not done  Assets:  Communication Skills Desire for Improvement  ADL's:  Intact  Cognition: WNL  Sleep:  Good   Screenings: AUDIT    Advertising Copywriter from 11/10/2023 in [facility]  Alcohol Use Disorder Identification Test Final Score (AUDIT) 11   CAGE-AID    Flowsheet Row Counselor from 11/10/2023 in [facility] [place]  CAGE-AID Score 3   GAD-7    Flowsheet Row Counselor from 10/02/2024 in [facility] [place]  Counselor from 09/22/2023 in [facility] at [facility] Visit from 04/20/2023 in [facility] for [facility] at [facility] Routine Prenatal from 01/30/2022 in [facility] for [facility] at [place] Clinical Support from 09/29/2021 in [facility] for [facility] at [place]  Total GAD-7 Score 13 20 11  0 3   PHQ2-9    Flowsheet Row Counselor from 10/02/2024 in [facility] at [facility] from 09/22/2023 in [facility] at [facility] Visit from 09/13/2023 in [facility] Visit from 04/20/2023 in [facility] for [facility] at [place] Nutrition from 02/18/2022 in [facility] Nutr Diab Ed  - A [facility] [place]  PHQ-2 Total Score 2 2 3 2  0  PHQ-9 Total Score 16 19 18 9  --   Flowsheet Row Counselor from 10/02/2024 in [facility] at [facility] ED from 08/03/2024 in [facility] Department at [facility] ED from 05/07/2024 in [facility] Emergency Department at [facility]  C-SSRS RISK CATEGORY Moderate Risk No Risk No Risk     Assessment and Plan: Sylvia Walter 24 year old African-American female who presents for medication management follow-up appointment.  Currently prescribed Zoloft  sertraline  50 mg daily which she reports she has been taking and tolerating well.  States her depression has stabilized with current Zoloft  dose.  Previously initiated on Strattera  for reported poor concentration and inattentiveness.  States she has been tolerating this Strattera  25 mg well we will plan to titrate this dose to 40 mg daily.  Still awaiting previous psychiatric history chart from [facility] previous provider.  And/or ADHD testing results.  Reports plans to follow-up locations in [place].  No other concerns noted at this visit.  Support,  encouragement and reassurance was provided.  Collaboration of Care: Collaboration of Care: Medication Management AEB continue sertraline  and increased atomoxetine  to 40 mg daily. - follow-up 3 months  Patient/Guardian was advised Release of Information must be obtained prior to any record release in order to collaborate their care with an outside provider. Patient/Guardian was advised if they have not already done so to contact the registration department  to sign all necessary forms in order for us  to release information regarding their care.   Consent: Patient/Guardian gives verbal consent for treatment and assignment of benefits for services provided during this visit. Patient/Guardian expressed understanding and agreed to proceed.    Sylvia LOISE Kerns, NP 10/10/2024, 7:10 PM     [1] No Known Allergies  "

## 2024-10-16 ENCOUNTER — Ambulatory Visit (HOSPITAL_COMMUNITY): Admitting: Licensed Clinical Social Worker

## 2024-10-30 ENCOUNTER — Ambulatory Visit (HOSPITAL_COMMUNITY): Admitting: Licensed Clinical Social Worker

## 2025-01-08 ENCOUNTER — Telehealth (HOSPITAL_COMMUNITY): Admitting: Family
